# Patient Record
Sex: Female | Born: 1955 | Race: White | Hispanic: No | Smoking: Current every day smoker
Health system: Southern US, Community
[De-identification: ages and names within clinical notes are randomized; demographics above are authoritative.]

## PROBLEM LIST (undated history)

## (undated) DIAGNOSIS — L409 Psoriasis, unspecified: Secondary | ICD-10-CM

## (undated) DIAGNOSIS — E559 Vitamin D deficiency, unspecified: Secondary | ICD-10-CM

## (undated) DIAGNOSIS — K649 Unspecified hemorrhoids: Secondary | ICD-10-CM

## (undated) DIAGNOSIS — R911 Solitary pulmonary nodule: Secondary | ICD-10-CM

## (undated) DIAGNOSIS — N750 Cyst of Bartholin's gland: Secondary | ICD-10-CM

## (undated) DIAGNOSIS — B009 Herpesviral infection, unspecified: Secondary | ICD-10-CM

## (undated) DIAGNOSIS — E785 Hyperlipidemia, unspecified: Secondary | ICD-10-CM

## (undated) DIAGNOSIS — K769 Liver disease, unspecified: Secondary | ICD-10-CM

## (undated) DIAGNOSIS — E049 Nontoxic goiter, unspecified: Secondary | ICD-10-CM

## (undated) DIAGNOSIS — I10 Essential (primary) hypertension: Secondary | ICD-10-CM

## (undated) HISTORY — DX: Nontoxic goiter, unspecified: E04.9

## (undated) HISTORY — DX: Hyperlipidemia, unspecified: E78.5

## (undated) HISTORY — DX: Essential (primary) hypertension: I10

## (undated) HISTORY — DX: Liver disease, unspecified: K76.9

## (undated) HISTORY — DX: Unspecified hemorrhoids: K64.9

## (undated) HISTORY — DX: Cyst of Bartholin's gland: N75.0

## (undated) HISTORY — DX: Herpesviral infection, unspecified: B00.9

## (undated) HISTORY — PX: BREAST BIOPSY: SHX20

## (undated) HISTORY — DX: Solitary pulmonary nodule: R91.1

## (undated) HISTORY — DX: Psoriasis, unspecified: L40.9

## (undated) HISTORY — PX: WISDOM TOOTH EXTRACTION: SHX21

## (undated) HISTORY — DX: Vitamin D deficiency, unspecified: E55.9

---

## 2003-02-13 ENCOUNTER — Encounter: Payer: Self-pay | Admitting: Obstetrics and Gynecology

## 2003-02-13 ENCOUNTER — Ambulatory Visit (HOSPITAL_COMMUNITY): Admission: RE | Admit: 2003-02-13 | Discharge: 2003-02-13 | Payer: Self-pay | Admitting: Obstetrics and Gynecology

## 2004-04-25 ENCOUNTER — Ambulatory Visit (HOSPITAL_COMMUNITY): Admission: RE | Admit: 2004-04-25 | Discharge: 2004-04-25 | Payer: Self-pay | Admitting: Obstetrics and Gynecology

## 2005-07-10 ENCOUNTER — Ambulatory Visit (HOSPITAL_COMMUNITY): Admission: RE | Admit: 2005-07-10 | Discharge: 2005-07-10 | Payer: Self-pay | Admitting: Obstetrics and Gynecology

## 2006-07-21 ENCOUNTER — Ambulatory Visit: Payer: Self-pay | Admitting: Gastroenterology

## 2006-09-01 ENCOUNTER — Ambulatory Visit (HOSPITAL_COMMUNITY): Admission: RE | Admit: 2006-09-01 | Discharge: 2006-09-01 | Payer: Self-pay | Admitting: Obstetrics and Gynecology

## 2006-10-20 ENCOUNTER — Ambulatory Visit: Payer: Self-pay | Admitting: Gastroenterology

## 2006-12-04 ENCOUNTER — Encounter (INDEPENDENT_AMBULATORY_CARE_PROVIDER_SITE_OTHER): Payer: Self-pay | Admitting: Specialist

## 2006-12-04 ENCOUNTER — Ambulatory Visit (HOSPITAL_COMMUNITY): Admission: RE | Admit: 2006-12-04 | Discharge: 2006-12-04 | Payer: Self-pay | Admitting: Gastroenterology

## 2007-03-04 ENCOUNTER — Ambulatory Visit: Payer: Self-pay | Admitting: Gastroenterology

## 2008-01-21 ENCOUNTER — Other Ambulatory Visit: Admission: RE | Admit: 2008-01-21 | Discharge: 2008-01-21 | Payer: Self-pay | Admitting: Obstetrics and Gynecology

## 2008-01-21 ENCOUNTER — Ambulatory Visit (HOSPITAL_COMMUNITY): Admission: RE | Admit: 2008-01-21 | Discharge: 2008-01-21 | Payer: Self-pay | Admitting: Obstetrics and Gynecology

## 2008-07-04 ENCOUNTER — Ambulatory Visit: Payer: Self-pay | Admitting: Gastroenterology

## 2009-04-12 ENCOUNTER — Ambulatory Visit: Payer: Self-pay | Admitting: Gastroenterology

## 2009-06-19 ENCOUNTER — Ambulatory Visit (HOSPITAL_COMMUNITY): Admission: RE | Admit: 2009-06-19 | Discharge: 2009-06-19 | Payer: Self-pay | Admitting: Obstetrics and Gynecology

## 2009-06-19 ENCOUNTER — Other Ambulatory Visit: Admission: RE | Admit: 2009-06-19 | Discharge: 2009-06-19 | Payer: Self-pay | Admitting: Obstetrics and Gynecology

## 2010-08-27 ENCOUNTER — Ambulatory Visit (HOSPITAL_COMMUNITY)
Admission: RE | Admit: 2010-08-27 | Discharge: 2010-08-27 | Payer: Self-pay | Source: Home / Self Care | Admitting: Obstetrics and Gynecology

## 2010-08-28 ENCOUNTER — Other Ambulatory Visit
Admission: RE | Admit: 2010-08-28 | Discharge: 2010-08-28 | Payer: Self-pay | Source: Home / Self Care | Admitting: Obstetrics and Gynecology

## 2012-03-09 ENCOUNTER — Other Ambulatory Visit: Payer: Self-pay | Admitting: Obstetrics and Gynecology

## 2012-03-09 DIAGNOSIS — Z139 Encounter for screening, unspecified: Secondary | ICD-10-CM

## 2012-04-08 ENCOUNTER — Other Ambulatory Visit: Payer: Self-pay | Admitting: Adult Health

## 2012-04-08 ENCOUNTER — Ambulatory Visit (HOSPITAL_COMMUNITY)
Admission: RE | Admit: 2012-04-08 | Discharge: 2012-04-08 | Disposition: A | Discharge status: Home / Self Care | Payer: Managed Care, Other (non HMO) | Source: Ambulatory Visit | Attending: Obstetrics and Gynecology | Admitting: Obstetrics and Gynecology

## 2012-04-08 ENCOUNTER — Other Ambulatory Visit (HOSPITAL_COMMUNITY)
Admission: RE | Admit: 2012-04-08 | Discharge: 2012-04-08 | Disposition: A | Discharge status: Home / Self Care | Payer: Managed Care, Other (non HMO) | Source: Ambulatory Visit | Attending: Obstetrics and Gynecology | Admitting: Obstetrics and Gynecology

## 2012-04-08 ENCOUNTER — Ambulatory Visit (HOSPITAL_COMMUNITY)
Admission: RE | Admit: 2012-04-08 | Discharge: 2012-04-08 | Disposition: A | Discharge status: Home / Self Care | Payer: Managed Care, Other (non HMO) | Source: Ambulatory Visit | Attending: Adult Health | Admitting: Adult Health

## 2012-04-08 DIAGNOSIS — R0989 Other specified symptoms and signs involving the circulatory and respiratory systems: Secondary | ICD-10-CM

## 2012-04-08 DIAGNOSIS — Z1151 Encounter for screening for human papillomavirus (HPV): Secondary | ICD-10-CM | POA: Insufficient documentation

## 2012-04-08 DIAGNOSIS — Z01419 Encounter for gynecological examination (general) (routine) without abnormal findings: Secondary | ICD-10-CM | POA: Insufficient documentation

## 2012-04-08 DIAGNOSIS — D509 Iron deficiency anemia, unspecified: Secondary | ICD-10-CM | POA: Insufficient documentation

## 2012-04-08 DIAGNOSIS — Z1231 Encounter for screening mammogram for malignant neoplasm of breast: Secondary | ICD-10-CM | POA: Insufficient documentation

## 2012-04-08 DIAGNOSIS — Z139 Encounter for screening, unspecified: Secondary | ICD-10-CM

## 2013-04-01 ENCOUNTER — Other Ambulatory Visit: Payer: Self-pay | Admitting: Adult Health

## 2013-04-01 DIAGNOSIS — Z139 Encounter for screening, unspecified: Secondary | ICD-10-CM

## 2013-05-10 ENCOUNTER — Other Ambulatory Visit (HOSPITAL_COMMUNITY): Payer: Self-pay | Admitting: Obstetrics and Gynecology

## 2013-05-10 ENCOUNTER — Other Ambulatory Visit: Payer: Self-pay | Admitting: *Deleted

## 2013-05-10 MED ORDER — AMLODIPINE BESYLATE 10 MG PO TABS: 10.0000 mg | ORAL_TABLET | Freq: Every day | ORAL | Status: DC

## 2013-05-12 ENCOUNTER — Other Ambulatory Visit: Payer: Self-pay | Admitting: Adult Health

## 2013-05-12 ENCOUNTER — Ambulatory Visit (HOSPITAL_COMMUNITY): Payer: Managed Care, Other (non HMO)

## 2013-05-24 ENCOUNTER — Encounter: Payer: Self-pay | Admitting: Adult Health

## 2013-05-24 ENCOUNTER — Ambulatory Visit (INDEPENDENT_AMBULATORY_CARE_PROVIDER_SITE_OTHER): Payer: BC Managed Care – PPO | Admitting: Adult Health

## 2013-05-24 ENCOUNTER — Ambulatory Visit (HOSPITAL_COMMUNITY)
Admission: RE | Admit: 2013-05-24 | Discharge: 2013-05-24 | Disposition: A | Discharge status: Home / Self Care | Payer: BC Managed Care – PPO | Source: Ambulatory Visit | Attending: Adult Health | Admitting: Adult Health

## 2013-05-24 VITALS — BP 140/80 | HR 72 | Ht 64.0 in | Wt 146.0 lb

## 2013-05-24 DIAGNOSIS — Z139 Encounter for screening, unspecified: Secondary | ICD-10-CM

## 2013-05-24 DIAGNOSIS — Z8619 Personal history of other infectious and parasitic diseases: Secondary | ICD-10-CM

## 2013-05-24 DIAGNOSIS — Z1212 Encounter for screening for malignant neoplasm of rectum: Secondary | ICD-10-CM

## 2013-05-24 DIAGNOSIS — E041 Nontoxic single thyroid nodule: Secondary | ICD-10-CM

## 2013-05-24 DIAGNOSIS — I10 Essential (primary) hypertension: Secondary | ICD-10-CM

## 2013-05-24 DIAGNOSIS — Z1231 Encounter for screening mammogram for malignant neoplasm of breast: Secondary | ICD-10-CM | POA: Insufficient documentation

## 2013-05-24 DIAGNOSIS — Z01419 Encounter for gynecological examination (general) (routine) without abnormal findings: Secondary | ICD-10-CM

## 2013-05-24 DIAGNOSIS — B1920 Unspecified viral hepatitis C without hepatic coma: Secondary | ICD-10-CM | POA: Insufficient documentation

## 2013-05-24 LAB — HEMOCCULT GUIAC POC 1CARD (OFFICE): Fecal Occult Blood, POC: NEGATIVE

## 2013-05-24 MED ORDER — VALACYCLOVIR HCL 1 G PO TABS: 1000.0000 mg | ORAL_TABLET | ORAL | Status: DC | PRN

## 2013-05-24 MED ORDER — CALCIPOTRIENE 0.005 % EX OINT: TOPICAL_OINTMENT | CUTANEOUS | Status: DC | PRN

## 2013-05-24 NOTE — Patient Instructions (Addendum)
Get colonoscopy call Dr Lovell Sheehan Mammogram yearly Physical in 1 year Get thyroid US as scheduled

## 2013-05-24 NOTE — Progress Notes (Signed)
Patient ID: Julia Marsh, female   DOB: May 03, 1956, 57 y.o.   MRN: [id number] History of Present Illness: Arantza is a 57 year old white female married in for physical. She had a normal pap with negative HPV in July 2013.  Current Medications, Allergies, Past Medical History, Past Surgical History, Family History and Social History were reviewed in [employer] record.     Review of Systems: Patient denies any headaches, blurred vision, shortness of breath, chest pain, abdominal pain, problems with bowel movements, urination, or intercourse. No joint pain or mood changes. psorisis on elbows.   Physical Exam:BP 140/80  Pulse 72  Ht 5\' 4"  (1.626 m)  Wt 146 lb (66.225 kg)  BMI 25.05 kg/m2 General:  Well developed, well nourished, no acute distress Skin:  Warm and dry Neck:  Midline trachea, enlarged thyroid Lungs; Clear to auscultation bilaterally Breast:  No dominant palpable mass, retraction, or nipple discharge Cardiovascular: Regular rate and rhythm Abdomen:  Soft, non tender, no hepatosplenomegaly Pelvic:  External genitalia is normal in appearance.  The vagina is normal in appearance for age.The cervix is atrophic.  Uterus is felt to be normal size, shape, and contour.  No  adnexal masses or tenderness noted. Rectal: Good sphincter tone, no polyps, or hemorrhoids felt.  Hemoccult negative. Extremities:  No swelling or varicosities noted Psych:  Alert andcooperative   Impression: Yearly gyn exam-no pap History of hepatitis C Hypertension HSV Thyroid nodule    Plan: Thyroid US 9/4 at 2 pm at [facility] Physical in 1 year  Mammogram today and yearly Call Dr. Lovell Sheehan to schedule colonoscopy Refilled valtrex x 1 year and dovonex x 1.

## 2013-05-26 ENCOUNTER — Telehealth: Payer: Self-pay | Admitting: Adult Health

## 2013-05-26 ENCOUNTER — Ambulatory Visit (HOSPITAL_COMMUNITY)
Admission: RE | Admit: 2013-05-26 | Discharge: 2013-05-26 | Disposition: A | Discharge status: Home / Self Care | Payer: BC Managed Care – PPO | Source: Ambulatory Visit | Attending: Adult Health | Admitting: Adult Health

## 2013-05-26 DIAGNOSIS — E041 Nontoxic single thyroid nodule: Secondary | ICD-10-CM | POA: Insufficient documentation

## 2013-05-26 NOTE — Telephone Encounter (Signed)
Left message on thyroid US results

## 2014-04-17 ENCOUNTER — Other Ambulatory Visit: Payer: Self-pay | Admitting: Adult Health

## 2014-04-17 DIAGNOSIS — Z1231 Encounter for screening mammogram for malignant neoplasm of breast: Secondary | ICD-10-CM

## 2014-05-31 ENCOUNTER — Encounter: Payer: Self-pay | Admitting: Adult Health

## 2014-05-31 ENCOUNTER — Ambulatory Visit (HOSPITAL_COMMUNITY)
Admission: RE | Admit: 2014-05-31 | Discharge: 2014-05-31 | Disposition: A | Discharge status: Home / Self Care | Payer: BC Managed Care – PPO | Source: Ambulatory Visit | Attending: Adult Health | Admitting: Adult Health

## 2014-05-31 ENCOUNTER — Ambulatory Visit (INDEPENDENT_AMBULATORY_CARE_PROVIDER_SITE_OTHER): Payer: BC Managed Care – PPO | Admitting: Adult Health

## 2014-05-31 VITALS — BP 154/88 | HR 74 | Ht 64.5 in | Wt 152.5 lb

## 2014-05-31 DIAGNOSIS — Z1212 Encounter for screening for malignant neoplasm of rectum: Secondary | ICD-10-CM

## 2014-05-31 DIAGNOSIS — Z01419 Encounter for gynecological examination (general) (routine) without abnormal findings: Secondary | ICD-10-CM

## 2014-05-31 DIAGNOSIS — Z1231 Encounter for screening mammogram for malignant neoplasm of breast: Secondary | ICD-10-CM | POA: Diagnosis present

## 2014-05-31 DIAGNOSIS — I10 Essential (primary) hypertension: Secondary | ICD-10-CM

## 2014-05-31 LAB — HEMOCCULT GUIAC POC 1CARD (OFFICE): Fecal Occult Blood, POC: NEGATIVE

## 2014-05-31 MED ORDER — VALACYCLOVIR HCL 1 G PO TABS: 1000.0000 mg | ORAL_TABLET | ORAL | Status: DC | PRN

## 2014-05-31 MED ORDER — AMLODIPINE BESYLATE 10 MG PO TABS: 10.0000 mg | ORAL_TABLET | Freq: Every day | ORAL | Status: DC

## 2014-05-31 MED ORDER — VALACYCLOVIR HCL 1 G PO TABS: 1000.0000 mg | ORAL_TABLET | Freq: Every day | ORAL | Status: DC

## 2014-05-31 NOTE — Patient Instructions (Signed)
Pap and physical in 1 year mammogram yearly Colonoscopy advised Get flu shot TRY LUVENA for vaginal moisture and ASTRO GLIDE for sex

## 2014-05-31 NOTE — Addendum Note (Signed)
Addended by: Derrek Monaco A on: 05/31/2014 12:07 PM   Modules accepted: Orders

## 2014-05-31 NOTE — Progress Notes (Signed)
Patient ID: Julia Marsh, female   DOB: 05-02-1956, 58 y.o.   MRN: [id number] History of Present Illness: Julia Marsh is a 58 year old white female, married in for physical,she had a normal pap with negative HPV 04/08/12.Has vaginal dryness.Has some rectal spotting at times, has hemorrhoid.  Current Medications, Allergies, Past Medical History, Past Surgical History, Family History and Social History were reviewed in [employer] record.     Review of Systems:  Patient denies any headaches, blurred vision, shortness of breath, chest pain, abdominal pain, problems with bowel movements, urination.Not having sex at present,no joint pain or mood swings.   Physical Exam:BP 154/88  Pulse 74  Ht 5' 4.5" (1.638 m)  Wt 152 lb 8 oz (69.174 kg)  BMI 25.78 kg/m2 General:  Well developed, well nourished, no acute distress Skin:  Warm and dry,tan Neck:  Midline trachea, enlarged thyroid R>L,had [place] in 2013 and 2014 was stable. Lungs; Clear to auscultation bilaterally Breast:  No dominant palpable mass, retraction, or nipple discharge Cardiovascular: Regular rate and rhythm Abdomen:  Soft, non tender, no hepatosplenomegaly Pelvic:  External genitalia is normal in appearance for age no lesions.  The vagina has decrease color, moisture and rugae and there is marble sized left bartholin cyst,non tender.The cervix is smooth.  Uterus is felt to be normal size, shape, and contour.  No   adnexal masses or tenderness noted. Rectal: Good sphincter tone, no polyps, internal hemorrhoids felt.  Hemoccult negative. Extremities:  No swelling or varicosities noted Psych:  No mood changes,alert and cooperative,seems happy,she is retired and lives at [employer] part of the time and fishes a lot, her Mom is elderly and is close by.   Impression: Yearly gyn exam Hypertension     Plan: Physical and pap in 1 year Mammogram yearly Colonoscopy advised Check CBC,CMP,TSH and lipids  Try Luvena for vaginal  moisture and astro glide with sex Refilled valtrex x 1 year  Refilled norvasc 10 mg 1 daily #30 with 11 refills Get flu shot

## 2014-06-01 ENCOUNTER — Telehealth: Payer: Self-pay | Admitting: Adult Health

## 2014-06-01 LAB — LIPID PANEL
Cholesterol: 257 mg/dL — ABNORMAL HIGH (ref 0–200)
HDL: 76 mg/dL (ref >39)
LDL Cholesterol: 161 mg/dL — ABNORMAL HIGH (ref 0–99)
Total CHOL/HDL Ratio: 3.4 Ratio
Triglycerides: 100 mg/dL (ref <150)
VLDL: 20 mg/dL (ref 0–40)

## 2014-06-01 LAB — CBC
HCT: 45 % (ref 36.0–46.0)
Hemoglobin: 15.5 g/dL — ABNORMAL HIGH (ref 12.0–15.0)
MCH: 32 pg (ref 26.0–34.0)
MCHC: 34.4 g/dL (ref 30.0–36.0)
MCV: 93 fL (ref 78.0–100.0)
Platelets: 190 10*3/uL (ref 150–400)
RBC: 4.84 MIL/uL (ref 3.87–5.11)
RDW: 13.8 % (ref 11.5–15.5)
WBC: 6.2 10*3/uL (ref 4.0–10.5)

## 2014-06-01 LAB — COMPREHENSIVE METABOLIC PANEL
ALT: 26 U/L (ref 0–35)
AST: 18 U/L (ref 0–37)
Albumin: 4.5 g/dL (ref 3.5–5.2)
Alkaline Phosphatase: 87 U/L (ref 39–117)
BUN: 8 mg/dL (ref 6–23)
CO2: 27 mEq/L (ref 19–32)
Calcium: 9.7 mg/dL (ref 8.4–10.5)
Chloride: 103 mEq/L (ref 96–112)
Creat: 0.72 mg/dL (ref 0.50–1.10)
Glucose, Bld: 89 mg/dL (ref 70–99)
Potassium: 4.4 mEq/L (ref 3.5–5.3)
Sodium: 137 mEq/L (ref 135–145)
Total Bilirubin: 0.6 mg/dL (ref 0.2–1.2)
Total Protein: 7.1 g/dL (ref 6.0–8.3)

## 2014-06-01 LAB — TSH: TSH: 3.076 u[IU]/mL (ref 0.350–4.500)

## 2014-06-01 NOTE — Telephone Encounter (Signed)
Pt aware of labs, will mail her a copy

## 2014-07-24 ENCOUNTER — Encounter: Payer: Self-pay | Admitting: Adult Health

## 2014-11-24 NOTE — H&P (Signed)
  NTS SOAP Note  Vital Signs:  Vitals as of: 11/28/4663: Systolic 993: Diastolic 91: Heart Rate 80: Temp 96.66F: Height 56ft 5.5in: Weight 158Lbs 0 Ounces: BMI 25.89  BMI : 25.89 kg/m2  Subjective: This 59 year old female presents fora screening TCS.  Nver has had a colonoscopy.  Denies any lower GI complaints.  Review of Symptoms:  Constitutional:unremarkable   Head:unremarkable Eyes:unremarkable   sinus problems Cardiovascular:  unremarkable Respiratory:cough Gastrointestinal:  unremarkable   Genitourinary:unremarkable   Musculoskeletal:unremarkable Skin:unremarkable Hematolgic/Lymphatic:unremarkable   Allergic/Immunologic:unremarkable   Past Medical History:  Reviewed  Past Medical History  Surgical History: none Medical Problems: HTN Allergies: pcn Medications: amlodipine,  valcyclovir   Social History:Reviewed  Social History  Preferred Language: English Race:  White Ethnicity: Not Hispanic / Latino Age: 57 year Marital Status:  M Alcohol: 2-3 per day   Smoking Status: Current every day smoker reviewed on 11/23/2014 Started Date:  Packs per day: 1.00 Functional Status reviewed on 11/23/2014 ------------------------------------------------ Bathing: Normal Cooking: Normal Dressing: Normal Driving: Normal Eating: Normal Managing Meds: Normal Oral Care: Normal Shopping: Normal Toileting: Normal Transferring: Normal Walking: Normal Cognitive Status reviewed on 11/23/2014 ------------------------------------------------ Attention: Normal Decision Making: Normal Language: Normal Memory: Normal Motor: Normal Perception: Normal Problem Solving: Normal Visual and Spatial: Normal   Family History:Reviewed  Family Health History Mother, Living; Atrial fibrillation;  Father, Deceased; Stroke (CVA);     Objective Information: General:Well appearing, well nourished in no distress. Heart:RRR, no murmur Lungs:  CTA  bilaterally, no wheezes, rhonchi, rales.  Breathing unlabored. Abdomen:Soft, NT/ND, no HSM, no masses. deferred to procedure  Assessment:Need for screening TCS  Diagnoses: V76.51  Z12.11 Screening for malignant neoplasm of colon (Encounter for screening for malignant neoplasm of colon)  Procedures: 57017 - OFFICE OUTPATIENT NEW 20 MINUTES    Plan:  Scheduled for a screening TCS on 12/19/14.   Patient Education:Alternative treatments to surgery were discussed with patient (and family).  Risks and benefits  of procedure including bleeding and perforation were fully explained to the patient (and family) who gave informed consent. Patient/family questions were addressed.  Follow-up:Pending Surgery

## 2014-12-19 ENCOUNTER — Encounter (HOSPITAL_COMMUNITY)
Admission: RE | Disposition: A | Discharge status: Home / Self Care | Payer: Self-pay | Source: Ambulatory Visit | Attending: General Surgery

## 2014-12-19 ENCOUNTER — Encounter (HOSPITAL_COMMUNITY): Payer: Self-pay

## 2014-12-19 ENCOUNTER — Ambulatory Visit (HOSPITAL_COMMUNITY)
Admission: RE | Admit: 2014-12-19 | Discharge: 2014-12-19 | Disposition: A | Discharge status: Home / Self Care | Payer: BLUE CROSS/BLUE SHIELD | Source: Ambulatory Visit | Attending: General Surgery | Admitting: General Surgery

## 2014-12-19 DIAGNOSIS — D120 Benign neoplasm of cecum: Secondary | ICD-10-CM | POA: Insufficient documentation

## 2014-12-19 DIAGNOSIS — I10 Essential (primary) hypertension: Secondary | ICD-10-CM | POA: Diagnosis not present

## 2014-12-19 DIAGNOSIS — F17200 Nicotine dependence, unspecified, uncomplicated: Secondary | ICD-10-CM | POA: Diagnosis not present

## 2014-12-19 DIAGNOSIS — Z1211 Encounter for screening for malignant neoplasm of colon: Secondary | ICD-10-CM | POA: Diagnosis present

## 2014-12-19 DIAGNOSIS — Z79899 Other long term (current) drug therapy: Secondary | ICD-10-CM | POA: Insufficient documentation

## 2014-12-19 HISTORY — PX: COLONOSCOPY: SHX5424

## 2014-12-19 SURGERY — COLONOSCOPY
Anesthesia: Moderate Sedation

## 2014-12-19 MED ORDER — MEPERIDINE HCL 50 MG/ML IJ SOLN
INTRAMUSCULAR | Status: DC | PRN
  Administered 2014-12-19: 50 mg via INTRAVENOUS

## 2014-12-19 MED ORDER — MIDAZOLAM HCL 5 MG/5ML IJ SOLN
INTRAMUSCULAR | Status: AC
  Filled 2014-12-19: qty 5

## 2014-12-19 MED ORDER — SODIUM CHLORIDE 0.9 % IV SOLN
INTRAVENOUS | Status: DC
  Administered 2014-12-19: 08:00:00 via INTRAVENOUS

## 2014-12-19 MED ORDER — MEPERIDINE HCL 50 MG/ML IJ SOLN
INTRAMUSCULAR | Status: AC
  Filled 2014-12-19: qty 1

## 2014-12-19 MED ORDER — MIDAZOLAM HCL 5 MG/5ML IJ SOLN
INTRAMUSCULAR | Status: DC | PRN
  Administered 2014-12-19: 1 mg via INTRAVENOUS
  Administered 2014-12-19: 3 mg via INTRAVENOUS

## 2014-12-19 MED ORDER — STERILE WATER FOR IRRIGATION IR SOLN
Status: DC | PRN
  Administered 2014-12-19: 09:00:00

## 2014-12-19 NOTE — Op Note (Signed)
[facility] [address] [place], [address]   COLONOSCOPY PROCEDURE REPORT     EXAM DATE: 2014/12/26  PATIENT NAME:      Julia Marsh, Julia Marsh           MR #:      [id number]  BIRTHDATE:       11-01-1955      VISIT #:     [id number]  ATTENDING:     Aviva Signs, MD     STATUS:     outpatient ASSISTANT:  INDICATIONS:  The patient is a 59 yr old female here for a colonoscopy due to average risk patient for colon cancer. PROCEDURE PERFORMED:     Colonoscopy with snare polypectomy MEDICATIONS:     Demerol 50 mg IV and Versed 4 mg IV ESTIMATED BLOOD LOSS:     None  CONSENT: The patient understands the risks and benefits of the procedure and understands that these risks include, but are not limited to: sedation, allergic reaction, infection, perforation and/or bleeding. Alternative means of evaluation and treatment include, among others: physical exam, x-rays, and/or surgical intervention. The patient elects to proceed with this endoscopic procedure.  DESCRIPTION OF PROCEDURE: During intra-op preparation period all mechanical & medical equipment was checked for proper function. Hand hygiene and appropriate measures for infection prevention was taken. After the risks, benefits and alternatives of the procedure were thoroughly explained, Informed consent was verified, confirmed and timeout was successfully executed by the treatment team. A digital exam revealed no abnormalities of the rectum. The EC-[id number] ([id number]) endoscope was introduced through the anus and advanced to the cecum, which was identified by both the appendix and ileocecal valve. adequate (Trilyte was used) The instrument was then slowly withdrawn as the colon was fully examined.Estimated blood loss is zero unless otherwise noted in this procedure report.   COLON FINDINGS: A sessile polyp ranging between 3-50mm in size with a friable surface was found at the cecum.  A polypectomy was performed  using snare cautery.  The resection was complete, the polyp tissue was completely retrieved and sent to histology.   The examination was otherwise normal. Retroflexed views revealed no abnormalities. The scope was then completely withdrawn from the patient and the procedure terminated. SCOPE WITHDRAWAL TIME: 9    ADVERSE EVENTS:      There were no immediate complications.  IMPRESSIONS:     1.  Sessile polyp ranging between 3-44mm in size was found at the cecum; polypectomy was performed using snare cautery 2.  The examination was otherwise normal  RECOMMENDATIONS:     1.  Await pathology results 2.  Repeat Colonoscopy in 3 years. RECALL:  _____________________________ Aviva Signs, MD eSigned:  Aviva Signs, MD 12/26/14 8:58 AM   cc:   CPT CODES: ICD CODES:  The ICD and CPT codes recommended by this software are interpretations from the data that the clinical staff has captured with the software.  The verification of the translation of this report to the ICD and CPT codes and modifiers is the sole responsibility of the health care institution and practicing physician where this report was generated.  [facility]. will not be held responsible for the validity of the ICD and CPT codes included on this report.  AMA assumes no liability for data contained or not contained herein. CPT is a Designer, television/film set of the [employer].

## 2014-12-19 NOTE — Interval H&P Note (Signed)
History and Physical Interval Note:  12/19/2014 8:32 AM  Julia Marsh  has presented today for surgery, with the diagnosis of screening  The various methods of treatment have been discussed with the patient and family. After consideration of risks, benefits and other options for treatment, the patient has consented to  Procedure(s): COLONOSCOPY (N/A) as a surgical intervention .  The patient's history has been reviewed, patient examined, no change in status, stable for surgery.  I have reviewed the patient's chart and labs.  Questions were answered to the patient's satisfaction.     Aviva Signs A

## 2014-12-19 NOTE — Discharge Instructions (Signed)
Colon Polyps Polyps are lumps of extra tissue growing inside the body. Polyps can grow in the large intestine (colon). Most colon polyps are noncancerous (benign). However, some colon polyps can become cancerous over time. Polyps that are larger than a pea may be harmful. To be safe, caregivers remove and test all polyps. CAUSES  Polyps form when mutations in the genes cause your cells to grow and divide even though no more tissue is needed. RISK FACTORS There are a number of risk factors that can increase your chances of getting colon polyps. They include:  Being older than 50 years.  Family history of colon polyps or colon cancer.  Long-term colon diseases, such as colitis or Crohn disease.  Being overweight.  Smoking.  Being inactive.  Drinking too much alcohol. SYMPTOMS  Most small polyps do not cause symptoms. If symptoms are present, they may include:  Blood in the stool. The stool may look dark red or black.  Constipation or diarrhea that lasts longer than 1 week. DIAGNOSIS People often do not know they have polyps until their caregiver finds them during a regular checkup. Your caregiver can use 4 tests to check for polyps:  Digital rectal exam. The caregiver wears gloves and feels inside the rectum. This test would find polyps only in the rectum.  Barium enema. The caregiver puts a liquid called barium into your rectum before taking X-rays of your colon. Barium makes your colon look white. Polyps are dark, so they are easy to see in the X-ray pictures.  Sigmoidoscopy. A thin, flexible tube (sigmoidoscope) is placed into your rectum. The sigmoidoscope has a light and tiny camera in it. The caregiver uses the sigmoidoscope to look at the last third of your colon.  Colonoscopy. This test is like sigmoidoscopy, but the caregiver looks at the entire colon. This is the most common method for finding and removing polyps. TREATMENT  Any polyps will be removed during a  sigmoidoscopy or colonoscopy. The polyps are then tested for cancer. PREVENTION  To help lower your risk of getting more colon polyps:  Eat plenty of fruits and vegetables. Avoid eating fatty foods.  Do not smoke.  Avoid drinking alcohol.  Exercise every day.  Lose weight if recommended by your caregiver.  Eat plenty of calcium and folate. Foods that are rich in calcium include milk, cheese, and broccoli. Foods that are rich in folate include chickpeas, kidney beans, and spinach. HOME CARE INSTRUCTIONS Keep all follow-up appointments as directed by your caregiver. You may need periodic exams to check for polyps. SEEK MEDICAL CARE IF: You notice bleeding during a bowel movement. Document Released: 06/04/2004 Document Revised: 12/01/2011 Document Reviewed: 11/18/2011 [facility] Patient Information 2015 [place], [place]. This information is not intended to replace advice given to you by your health care provider. Make sure you discuss any questions you have with your health care provider. Colonoscopy, Care After Refer to this sheet in the next few weeks. These instructions provide you with information on caring for yourself after your procedure. Your health care provider may also give you more specific instructions. Your treatment has been planned according to current medical practices, but problems sometimes occur. Call your health care provider if you have any problems or questions after your procedure. WHAT TO EXPECT AFTER THE PROCEDURE  After your procedure, it is typical to have the following:  A small amount of blood in your stool.  Moderate amounts of gas and mild abdominal cramping or bloating. [place]  TO EXPECT AFTER THE PROCEDURE   After your procedure, it is typical to have the following:   A small amount of blood in your stool.   Moderate amounts of gas and mild abdominal cramping or bloating.  HOME CARE INSTRUCTIONS   Do not drive, operate machinery, or sign important documents for 24 hours.   You may shower and resume your regular physical activities, but move at a slower pace for the first 24 hours.   Take frequent rest periods for the first 24 hours.   Walk around or put a warm  pack on your abdomen to help reduce abdominal cramping and bloating.   Drink enough fluids to keep your urine clear or pale yellow.   You may resume your normal diet as instructed by your health care provider. Avoid heavy or fried foods that are hard to digest.   Avoid drinking alcohol for 24 hours or as instructed by your health care provider.   Only take over-the-counter or prescription medicines as directed by your health care provider.   If a tissue sample (biopsy) was taken during your procedure:   Do not take aspirin or blood thinners for 7 days, or as instructed by your health care provider.   Do not drink alcohol for 7 days, or as instructed by your health care provider.   Eat soft foods for the first 24 hours.  SEEK MEDICAL CARE IF:  You have persistent spotting of blood in your stool 2-3 days after the procedure.  SEEK IMMEDIATE MEDICAL CARE IF:   You have more than a small spotting of blood in your stool.   You pass large blood clots in your stool.   Your abdomen is swollen (distended).   You have nausea or vomiting.   You have a fever.   You have increasing abdominal pain that is not relieved with medicine.  Document Released: 04/22/2004 Document Revised: 06/29/2013 Document Reviewed: 05/16/2013  [employer] Patient Information 2015 [employer]. This information is not intended to replace advice given to you by your health care provider. Make sure you discuss any questions you have with your health care provider.

## 2014-12-20 ENCOUNTER — Encounter (HOSPITAL_COMMUNITY): Payer: Self-pay | Admitting: General Surgery

## 2015-05-09 ENCOUNTER — Other Ambulatory Visit: Payer: Self-pay | Admitting: Adult Health

## 2015-05-09 DIAGNOSIS — Z1231 Encounter for screening mammogram for malignant neoplasm of breast: Secondary | ICD-10-CM

## 2015-06-04 ENCOUNTER — Other Ambulatory Visit (HOSPITAL_COMMUNITY)
Admission: RE | Admit: 2015-06-04 | Discharge: 2015-06-04 | Disposition: A | Discharge status: Home / Self Care | Payer: BLUE CROSS/BLUE SHIELD | Source: Ambulatory Visit | Attending: Adult Health | Admitting: Adult Health

## 2015-06-04 ENCOUNTER — Ambulatory Visit (INDEPENDENT_AMBULATORY_CARE_PROVIDER_SITE_OTHER): Payer: BLUE CROSS/BLUE SHIELD | Admitting: Adult Health

## 2015-06-04 ENCOUNTER — Ambulatory Visit (HOSPITAL_COMMUNITY)
Admission: RE | Admit: 2015-06-04 | Discharge: 2015-06-04 | Disposition: A | Discharge status: Home / Self Care | Payer: BLUE CROSS/BLUE SHIELD | Source: Ambulatory Visit | Attending: Adult Health | Admitting: Adult Health

## 2015-06-04 ENCOUNTER — Encounter: Payer: Self-pay | Admitting: Adult Health

## 2015-06-04 VITALS — BP 142/88 | HR 76 | Ht 64.0 in | Wt 152.5 lb

## 2015-06-04 DIAGNOSIS — Z1151 Encounter for screening for human papillomavirus (HPV): Secondary | ICD-10-CM | POA: Diagnosis present

## 2015-06-04 DIAGNOSIS — Z1231 Encounter for screening mammogram for malignant neoplasm of breast: Secondary | ICD-10-CM | POA: Diagnosis not present

## 2015-06-04 DIAGNOSIS — Z1212 Encounter for screening for malignant neoplasm of rectum: Secondary | ICD-10-CM | POA: Diagnosis not present

## 2015-06-04 DIAGNOSIS — Z01419 Encounter for gynecological examination (general) (routine) without abnormal findings: Secondary | ICD-10-CM

## 2015-06-04 DIAGNOSIS — Z8619 Personal history of other infectious and parasitic diseases: Secondary | ICD-10-CM

## 2015-06-04 DIAGNOSIS — I10 Essential (primary) hypertension: Secondary | ICD-10-CM

## 2015-06-04 DIAGNOSIS — E041 Nontoxic single thyroid nodule: Secondary | ICD-10-CM

## 2015-06-04 LAB — HEMOCCULT GUIAC POC 1CARD (OFFICE): FECAL OCCULT BLD: NEGATIVE

## 2015-06-04 MED ORDER — AMLODIPINE BESYLATE 10 MG PO TABS: 10.0000 mg | ORAL_TABLET | Freq: Every day | ORAL | Status: DC

## 2015-06-04 MED ORDER — VALACYCLOVIR HCL 1 G PO TABS: 1000.0000 mg | ORAL_TABLET | Freq: Every day | ORAL | Status: DC

## 2015-06-04 NOTE — Progress Notes (Signed)
Patient ID: Julia Marsh, female   DOB: 1956-06-22, 59 y.o.   MRN: [id number] History of Present Illness: Julia Marsh is a 59 year old white female, married, in for well woman gyn exam and pap.She says she has gained weight but is same as last year.She [place] of time at the beach.   Current Medications, Allergies, Past Medical History, Past Surgical History, Family History and Social History were reviewed in [employer] record.     Review of Systems: Patient denies any headaches, hearing loss, fatigue, blurred vision, shortness of breath, chest pain, abdominal pain, problems with bowel movements, urination, or intercourse(has decreased libido). No joint pain or mood swings.    Physical Exam:BP 142/88 mmHg  Pulse 76  Ht 5\' 4"  (1.626 m)  Wt 152 lb 8 oz (69.174 kg)  BMI 26.16 kg/m2 General:  Well developed, well nourished, no acute distress Skin:  Warm and dry Neck:  Midline trachea, thyroid, right nodule,feels stable, good ROM, no lymphadenopathy,no carotid bruits heard Lungs; Clear to auscultation bilaterally Breast:  No dominant palpable mass, retraction, or nipple discharge Cardiovascular: Regular rate and rhythm Abdomen:  Soft, non tender, no hepatosplenomegaly Pelvic:  External genitalia is normal in appearance, no lesions.  The vagina has decreased color, moisture and rugae, has marble sized bartholin cyst on left,non inflamed, not tender. Urethra has no lesions or masses. The cervix is smooth,pap with HPV performed.  Uterus is felt to be normal size, shape, and contour.  No adnexal masses or tenderness noted.Bladder is non tender, no masses felt. Rectal: Good sphincter tone, no polyps, or hemorrhoids felt.  Hemoccult negative. Extremities/musculoskeletal:  No swelling or varicosities noted, no clubbing or cyanosis Psych:  No mood changes, alert and cooperative,seems happy Discussed having dates for regular sex and as long as bartholin cyst not painful will leave  it alone.She takes norvasc at night and says she needs refills on it and valtrex.  Impression: Well woman gyn exam with pap  Hypertension Right thyroid nodule History of herpes    Plan: Check CBC,CMP,TSH and lipids Get mammogram yearly, has one today Colonoscopy per GI Refilled norvasc 10 mg #30 take 1 daily with 11 refills Refilled valtrex 1 gm #30 take 1 daily with 11 refills See Dr Scot Dock flu shot and shingles and pneumonia vaccines

## 2015-06-04 NOTE — Patient Instructions (Signed)
Physical in 1 year Mammogram yearly See Dr Scot Dock flu shot, shingles and pneumonia shot  Colonoscopy per GI

## 2015-06-05 LAB — COMPREHENSIVE METABOLIC PANEL
ALT: 46 IU/L — ABNORMAL HIGH (ref 0–32)
AST: 35 IU/L (ref 0–40)
Albumin/Globulin Ratio: 1.8 (ref 1.1–2.5)
Albumin: 4.9 g/dL — ABNORMAL HIGH (ref 3.6–4.8)
Alkaline Phosphatase: 105 IU/L (ref 39–117)
BUN/Creatinine Ratio: 10 — ABNORMAL LOW (ref 11–26)
BUN: 8 mg/dL (ref 8–27)
Bilirubin Total: 0.5 mg/dL (ref 0.0–1.2)
CALCIUM: 9.8 mg/dL (ref 8.7–10.3)
CO2: 25 mmol/L (ref 18–29)
CREATININE: 0.78 mg/dL (ref 0.57–1.00)
Chloride: 100 mmol/L (ref 97–108)
GFR calc Af Amer: 96 mL/min/{1.73_m2} (ref >59)
GFR, EST NON AFRICAN AMERICAN: 83 mL/min/{1.73_m2} (ref >59)
GLUCOSE: 110 mg/dL — AB (ref 65–99)
Globulin, Total: 2.7 g/dL (ref 1.5–4.5)
Potassium: 4.6 mmol/L (ref 3.5–5.2)
Sodium: 142 mmol/L (ref 134–144)
Total Protein: 7.6 g/dL (ref 6.0–8.5)

## 2015-06-05 LAB — CYTOLOGY - PAP

## 2015-06-05 LAB — CBC
Hematocrit: 47 % — ABNORMAL HIGH (ref 34.0–46.6)
Hemoglobin: 16.3 g/dL — ABNORMAL HIGH (ref 11.1–15.9)
MCH: 33.3 pg — ABNORMAL HIGH (ref 26.6–33.0)
MCHC: 34.7 g/dL (ref 31.5–35.7)
MCV: 96 fL (ref 79–97)
Platelets: 252 10*3/uL (ref 150–379)
RBC: 4.89 x10E6/uL (ref 3.77–5.28)
RDW: 13.7 % (ref 12.3–15.4)
WBC: 7.1 10*3/uL (ref 3.4–10.8)

## 2015-06-05 LAB — LIPID PANEL
CHOLESTEROL TOTAL: 288 mg/dL — AB (ref 100–199)
Chol/HDL Ratio: 3.5 ratio units (ref 0.0–4.4)
HDL: 82 mg/dL (ref >39)
LDL CALC: 188 mg/dL — AB (ref 0–99)
Triglycerides: 92 mg/dL (ref 0–149)
VLDL CHOLESTEROL CAL: 18 mg/dL (ref 5–40)

## 2015-06-05 LAB — TSH: TSH: 4.43 u[IU]/mL (ref 0.450–4.500)

## 2015-06-06 ENCOUNTER — Telehealth: Payer: Self-pay | Admitting: Adult Health

## 2015-06-06 NOTE — Telephone Encounter (Signed)
Pt aware of labs and pap 

## 2016-05-05 ENCOUNTER — Other Ambulatory Visit: Payer: Self-pay | Admitting: Adult Health

## 2016-05-05 DIAGNOSIS — Z1231 Encounter for screening mammogram for malignant neoplasm of breast: Secondary | ICD-10-CM

## 2016-06-09 ENCOUNTER — Ambulatory Visit (INDEPENDENT_AMBULATORY_CARE_PROVIDER_SITE_OTHER): Payer: BLUE CROSS/BLUE SHIELD | Admitting: Adult Health

## 2016-06-09 ENCOUNTER — Ambulatory Visit (HOSPITAL_COMMUNITY)
Admission: RE | Admit: 2016-06-09 | Discharge: 2016-06-09 | Disposition: A | Discharge status: Home / Self Care | Payer: BLUE CROSS/BLUE SHIELD | Source: Ambulatory Visit | Attending: Adult Health | Admitting: Adult Health

## 2016-06-09 ENCOUNTER — Encounter: Payer: Self-pay | Admitting: Adult Health

## 2016-06-09 VITALS — BP 130/72 | HR 72 | Ht 64.0 in | Wt 154.6 lb

## 2016-06-09 DIAGNOSIS — Z1231 Encounter for screening mammogram for malignant neoplasm of breast: Secondary | ICD-10-CM | POA: Insufficient documentation

## 2016-06-09 DIAGNOSIS — E041 Nontoxic single thyroid nodule: Secondary | ICD-10-CM

## 2016-06-09 DIAGNOSIS — Z01419 Encounter for gynecological examination (general) (routine) without abnormal findings: Secondary | ICD-10-CM

## 2016-06-09 DIAGNOSIS — Z1211 Encounter for screening for malignant neoplasm of colon: Secondary | ICD-10-CM | POA: Diagnosis not present

## 2016-06-09 DIAGNOSIS — Z01411 Encounter for gynecological examination (general) (routine) with abnormal findings: Secondary | ICD-10-CM | POA: Diagnosis not present

## 2016-06-09 DIAGNOSIS — I10 Essential (primary) hypertension: Secondary | ICD-10-CM | POA: Diagnosis not present

## 2016-06-09 LAB — HEMOCCULT GUIAC POC 1CARD (OFFICE): Fecal Occult Blood, POC: NEGATIVE

## 2016-06-09 MED ORDER — AMLODIPINE BESYLATE 10 MG PO TABS: 10.0000 mg | ORAL_TABLET | Freq: Every day | ORAL | 11 refills | Status: DC

## 2016-06-09 MED ORDER — CALCIPOTRIENE 0.005 % EX CREA: TOPICAL_CREAM | Freq: Two times a day (BID) | CUTANEOUS | 0 refills | Status: DC

## 2016-06-09 NOTE — Patient Instructions (Signed)
Physical in 1 year Mammogram today  Thyroid US 9/20 at 10:30 am

## 2016-06-09 NOTE — Progress Notes (Addendum)
Patient ID: Julia Marsh, female   DOB: 1956/03/20, 60 y.o.   MRN: [id number] History of Present Illness: Julia Marsh is a 60 year old white female, married in for a well woman gyn exam,she had a normal pap with negative HPV 06/04/15.Husband has lung cancer but doing well and her brother is having issues now and she is concerned, may have to go to [facility] to check on him. PCP is Dr Willey Blade but she has not seen him lately.   Current Medications, Allergies, Past Medical History, Past Surgical History, Family History and Social History were reviewed in [employer] record.     Review of Systems: Patient denies any headaches, hearing loss, fatigue, blurred vision, shortness of breath, chest pain, abdominal pain, problems with bowel movements, urination, or intercourse(not having sex). No joint pain or mood swings.She notices vaginal odor at times.    Physical Exam:BP 130/72   Pulse 72   Ht 5\' 4"  (1.626 m)   Wt 154 lb 9.6 oz (70.1 kg)   BMI 26.54 kg/m  General:  Well developed, well nourished, no acute distress Skin:  Warm and dry Neck:  Midline trachea,  Right thyroid nodule felt, good ROM, no lymphadenopathy Lungs; Clear to auscultation bilaterally Breast:  No dominant palpable mass, retraction, or nipple discharge Cardiovascular: Regular rate and rhythm Abdomen:  Soft, non tender, no hepatosplenomegaly Pelvic:  External genitalia is normal in appearance, no lesions.  The vagina is pale with loss of color,mositure and rugae. Urethra has no lesions or masses. The cervix is smooth.  Uterus is felt to be normal size, shape, and contour.  No adnexal masses or tenderness noted.Bladder is non tender, no masses felt. Rectal: Good sphincter tone, no polyps, + hemorrhoids felt.  Hemoccult negative. Extremities/musculoskeletal:  No swelling or varicosities noted, no clubbing or cyanosis Psych:  No mood changes, alert and cooperative,seems happy.but stressed  She is down to less than 10  cigarettes a day.  Impression:    1. Well woman exam with routine gynecological exam   2. Essential hypertension   3. Thyroid nodule      Plan: Check CBC,CMP,TSH and lipids,A1c and vitamin D Mammogram today Thyroid US 9/20 at 10:30 am at [facility] Physical in 1 year, pap in 2019 To gt flu shot today Refilled norvasc 10 mg #30 take 1 daily with 11 refills Refilled calcipotriene 0.005% cream #60 gm apply bid prn, no refills Make appt to see Dr Willey Blade since it has been awhile

## 2016-06-10 ENCOUNTER — Telehealth: Payer: Self-pay | Admitting: Adult Health

## 2016-06-10 ENCOUNTER — Encounter: Payer: Self-pay | Admitting: Adult Health

## 2016-06-10 DIAGNOSIS — E559 Vitamin D deficiency, unspecified: Secondary | ICD-10-CM

## 2016-06-10 DIAGNOSIS — E785 Hyperlipidemia, unspecified: Secondary | ICD-10-CM

## 2016-06-10 HISTORY — DX: Vitamin D deficiency, unspecified: E55.9

## 2016-06-10 HISTORY — DX: Hyperlipidemia, unspecified: E78.5

## 2016-06-10 LAB — COMPREHENSIVE METABOLIC PANEL
A/G RATIO: 1.5 (ref 1.2–2.2)
ALT: 45 IU/L — AB (ref 0–32)
AST: 34 IU/L (ref 0–40)
Albumin: 4.9 g/dL — ABNORMAL HIGH (ref 3.6–4.8)
Alkaline Phosphatase: 108 IU/L (ref 39–117)
BUN/Creatinine Ratio: 13 (ref 12–28)
BUN: 12 mg/dL (ref 8–27)
Bilirubin Total: 0.5 mg/dL (ref 0.0–1.2)
CO2: 22 mmol/L (ref 18–29)
Calcium: 10.3 mg/dL (ref 8.7–10.3)
Chloride: 95 mmol/L — ABNORMAL LOW (ref 96–106)
Creatinine, Ser: 0.94 mg/dL (ref 0.57–1.00)
GFR calc Af Amer: 76 mL/min/{1.73_m2} (ref >59)
GFR calc non Af Amer: 66 mL/min/{1.73_m2} (ref >59)
Globulin, Total: 3.2 g/dL (ref 1.5–4.5)
Glucose: 94 mg/dL (ref 65–99)
POTASSIUM: 4.4 mmol/L (ref 3.5–5.2)
SODIUM: 139 mmol/L (ref 134–144)
Total Protein: 8.1 g/dL (ref 6.0–8.5)

## 2016-06-10 LAB — CBC
HEMATOCRIT: 47.5 % — AB (ref 34.0–46.6)
Hemoglobin: 16.6 g/dL — ABNORMAL HIGH (ref 11.1–15.9)
MCH: 33.3 pg — ABNORMAL HIGH (ref 26.6–33.0)
MCHC: 34.9 g/dL (ref 31.5–35.7)
MCV: 95 fL (ref 79–97)
PLATELETS: 223 10*3/uL (ref 150–379)
RBC: 4.99 x10E6/uL (ref 3.77–5.28)
RDW: 13.7 % (ref 12.3–15.4)
WBC: 7.4 10*3/uL (ref 3.4–10.8)

## 2016-06-10 LAB — HEMOGLOBIN A1C
Est. average glucose Bld gHb Est-mCnc: 105 mg/dL
Hgb A1c MFr Bld: 5.3 % (ref 4.8–5.6)

## 2016-06-10 LAB — LIPID PANEL
CHOLESTEROL TOTAL: 303 mg/dL — AB (ref 100–199)
Chol/HDL Ratio: 4.7 ratio units — ABNORMAL HIGH (ref 0.0–4.4)
HDL: 64 mg/dL (ref >39)
LDL Calculated: 177 mg/dL — ABNORMAL HIGH (ref 0–99)
TRIGLYCERIDES: 310 mg/dL — AB (ref 0–149)
VLDL Cholesterol Cal: 62 mg/dL — ABNORMAL HIGH (ref 5–40)

## 2016-06-10 LAB — TSH: TSH: 3.4 u[IU]/mL (ref 0.450–4.500)

## 2016-06-10 LAB — VITAMIN D 25 HYDROXY (VIT D DEFICIENCY, FRACTURES): VIT D 25 HYDROXY: 27.8 ng/mL — AB (ref 30.0–100.0)

## 2016-06-10 MED ORDER — CHOLECALCIFEROL 125 MCG (5000 UT) PO CAPS: 5000.0000 [IU] | ORAL_CAPSULE | Freq: Every day | ORAL | Status: DC

## 2016-06-10 NOTE — Telephone Encounter (Signed)
Left message that thyroid US was normal

## 2016-06-10 NOTE — Telephone Encounter (Signed)
Pt aware of labs, elevated cholesterol and trig. will recheck lipids fasting,in 2-3 weeks, and take vitamin  D 3 5000 IU daily

## 2016-06-11 ENCOUNTER — Ambulatory Visit (HOSPITAL_COMMUNITY): Payer: BLUE CROSS/BLUE SHIELD

## 2016-06-30 ENCOUNTER — Other Ambulatory Visit: Payer: Self-pay | Admitting: Adult Health

## 2016-07-03 ENCOUNTER — Telehealth: Payer: Self-pay | Admitting: Adult Health

## 2016-07-03 LAB — COMPREHENSIVE METABOLIC PANEL
ALK PHOS: 95 IU/L (ref 39–117)
ALT: 33 IU/L — ABNORMAL HIGH (ref 0–32)
AST: 31 IU/L (ref 0–40)
Albumin/Globulin Ratio: 1.7 (ref 1.2–2.2)
Albumin: 4.6 g/dL (ref 3.6–4.8)
BUN/Creatinine Ratio: 10 — ABNORMAL LOW (ref 12–28)
BUN: 8 mg/dL (ref 8–27)
Bilirubin Total: 0.3 mg/dL (ref 0.0–1.2)
CALCIUM: 9.2 mg/dL (ref 8.7–10.3)
CO2: 25 mmol/L (ref 18–29)
CREATININE: 0.81 mg/dL (ref 0.57–1.00)
Chloride: 102 mmol/L (ref 96–106)
GFR calc Af Amer: 91 mL/min/{1.73_m2} (ref >59)
GFR calc non Af Amer: 79 mL/min/{1.73_m2} (ref >59)
Globulin, Total: 2.7 g/dL (ref 1.5–4.5)
Glucose: 88 mg/dL (ref 65–99)
POTASSIUM: 4.6 mmol/L (ref 3.5–5.2)
Sodium: 143 mmol/L (ref 134–144)
Total Protein: 7.3 g/dL (ref 6.0–8.5)

## 2016-07-03 LAB — LIPID PANEL
CHOLESTEROL TOTAL: 250 mg/dL — AB (ref 100–199)
Chol/HDL Ratio: 3.9 ratio units (ref 0.0–4.4)
HDL: 64 mg/dL (ref >39)
LDL Calculated: 162 mg/dL — ABNORMAL HIGH (ref 0–99)
TRIGLYCERIDES: 122 mg/dL (ref 0–149)
VLDL Cholesterol Cal: 24 mg/dL (ref 5–40)

## 2016-07-03 NOTE — Telephone Encounter (Signed)
Left message that labs better,keep eating healthy and stay active

## 2017-03-08 ENCOUNTER — Other Ambulatory Visit: Payer: Self-pay | Admitting: Adult Health

## 2017-06-08 ENCOUNTER — Other Ambulatory Visit: Payer: Self-pay | Admitting: Adult Health

## 2017-06-08 DIAGNOSIS — Z1231 Encounter for screening mammogram for malignant neoplasm of breast: Secondary | ICD-10-CM

## 2017-06-10 ENCOUNTER — Ambulatory Visit (INDEPENDENT_AMBULATORY_CARE_PROVIDER_SITE_OTHER): Payer: BLUE CROSS/BLUE SHIELD | Admitting: Adult Health

## 2017-06-10 ENCOUNTER — Ambulatory Visit (HOSPITAL_COMMUNITY)
Admission: RE | Admit: 2017-06-10 | Discharge: 2017-06-10 | Disposition: A | Discharge status: Home / Self Care | Payer: BLUE CROSS/BLUE SHIELD | Source: Ambulatory Visit | Attending: Adult Health | Admitting: Adult Health

## 2017-06-10 ENCOUNTER — Encounter: Payer: Self-pay | Admitting: Adult Health

## 2017-06-10 VITALS — BP 110/70 | HR 76 | Ht 64.4 in | Wt 147.0 lb

## 2017-06-10 DIAGNOSIS — Z01411 Encounter for gynecological examination (general) (routine) with abnormal findings: Secondary | ICD-10-CM

## 2017-06-10 DIAGNOSIS — Z1212 Encounter for screening for malignant neoplasm of rectum: Secondary | ICD-10-CM

## 2017-06-10 DIAGNOSIS — E559 Vitamin D deficiency, unspecified: Secondary | ICD-10-CM

## 2017-06-10 DIAGNOSIS — Z01419 Encounter for gynecological examination (general) (routine) without abnormal findings: Secondary | ICD-10-CM

## 2017-06-10 DIAGNOSIS — Z23 Encounter for immunization: Secondary | ICD-10-CM | POA: Diagnosis not present

## 2017-06-10 DIAGNOSIS — Z113 Encounter for screening for infections with a predominantly sexual mode of transmission: Secondary | ICD-10-CM

## 2017-06-10 DIAGNOSIS — I10 Essential (primary) hypertension: Secondary | ICD-10-CM | POA: Diagnosis not present

## 2017-06-10 DIAGNOSIS — Z1211 Encounter for screening for malignant neoplasm of colon: Secondary | ICD-10-CM | POA: Diagnosis not present

## 2017-06-10 DIAGNOSIS — Z1231 Encounter for screening mammogram for malignant neoplasm of breast: Secondary | ICD-10-CM | POA: Diagnosis not present

## 2017-06-10 DIAGNOSIS — E785 Hyperlipidemia, unspecified: Secondary | ICD-10-CM | POA: Diagnosis not present

## 2017-06-10 LAB — HEMOCCULT GUIAC POC 1CARD (OFFICE): Fecal Occult Blood, POC: NEGATIVE

## 2017-06-10 MED ORDER — AMLODIPINE BESYLATE 10 MG PO TABS: 10.0000 mg | ORAL_TABLET | Freq: Every day | ORAL | 11 refills | Status: DC

## 2017-06-10 NOTE — Progress Notes (Signed)
Patient ID: Julia Marsh, female   DOB: 1956-03-07, 61 y.o.   MRN: [id number] History of Present Illness: Julia Marsh is a 61 year old white female, recently widowed this spring, in for well woman gyn exam, she had normal pap and negative HPV 06/04/15. She has new friend and would like to be sexually active.She is going to have grand baby in about 2 weeks.  PCP is Dr Willey Blade.    Current Medications, Allergies, Past Medical History, Past Surgical History, Family History and Social History were reviewed in [employer] record.     Review of Systems: Patient denies any headaches, hearing loss, fatigue, blurred vision, shortness of breath, chest pain, abdominal pain, problems with bowel movements, urination, or intercourse(not currently active, but wants to be). No joint pain or mood swings.    Physical Exam:BP 110/70 (BP Location: Left Arm, Patient Position: Sitting, Cuff Size: Small)   Pulse 76   Ht 5' 4.4" (1.636 m)   Wt 147 lb (66.7 kg)   BMI 24.92 kg/m   General:  Well developed, well nourished, no acute distress Skin:  Warm and dry,tan Neck:  Midline trachea, normal thyroid, good ROM, no lymphadenopathy,no carotid bruits heard Lungs; Clear to auscultation bilaterally Breast:  No dominant palpable mass, retraction, or nipple discharge Cardiovascular: Regular rate and rhythm Abdomen:  Soft, non tender, no hepatosplenomegaly Pelvic:  External genitalia is normal in appearance, no lesions.  The vagina is pale with loss of moisture and rugae. Urethra has no lesions or masses. The cervix is smooth.  Uterus is felt to be normal size, shape, and contour.  No adnexal masses or tenderness noted.Bladder is non tender, no masses felt.GC/CHL obtained. Rectal: Good sphincter tone, no polyps, or hemorrhoids felt.  Hemoccult negative. Extremities/musculoskeletal:  No swelling or varicosities noted, no clubbing or cyanosis Psych:  No mood changes, alert and cooperative,seems happy PHQ 2  score 0.discussed using luvena for added vaginal moisture and astoglide with sex and massage at introitus to stretch, may even get sex toy, try different positions, for comfort.   Impression: 1. Encounter for well woman exam   2. Screening for colorectal cancer   3. Essential hypertension   4. Dyslipidemia   5. Mild vitamin D deficiency   6. Screening examination for STD (sexually transmitted disease)       Plan: Check CBC,CMP,TSH and lipids,vitamin D,HIV and RPR GC/CHL sent  Get flu shot and Tdap  Try luvena and get astroglide for use with sex Pap and physical in 1 year

## 2017-06-12 DIAGNOSIS — E559 Vitamin D deficiency, unspecified: Secondary | ICD-10-CM | POA: Diagnosis not present

## 2017-06-12 DIAGNOSIS — Z01419 Encounter for gynecological examination (general) (routine) without abnormal findings: Secondary | ICD-10-CM | POA: Diagnosis not present

## 2017-06-12 DIAGNOSIS — I10 Essential (primary) hypertension: Secondary | ICD-10-CM | POA: Diagnosis not present

## 2017-06-12 DIAGNOSIS — E785 Hyperlipidemia, unspecified: Secondary | ICD-10-CM | POA: Diagnosis not present

## 2017-06-12 LAB — GC/CHLAMYDIA PROBE AMP
Chlamydia trachomatis, NAA: NEGATIVE
Neisseria gonorrhoeae by PCR: NEGATIVE

## 2017-06-13 LAB — COMPREHENSIVE METABOLIC PANEL
A/G RATIO: 1.7 (ref 1.2–2.2)
ALBUMIN: 4.8 g/dL (ref 3.6–4.8)
ALK PHOS: 98 IU/L (ref 39–117)
ALT: 20 IU/L (ref 0–32)
AST: 20 IU/L (ref 0–40)
BUN / CREAT RATIO: 13 (ref 12–28)
BUN: 12 mg/dL (ref 8–27)
Bilirubin Total: 0.8 mg/dL (ref 0.0–1.2)
CHLORIDE: 99 mmol/L (ref 96–106)
CO2: 24 mmol/L (ref 20–29)
Calcium: 10.3 mg/dL (ref 8.7–10.3)
Creatinine, Ser: 0.91 mg/dL (ref 0.57–1.00)
GFR calc non Af Amer: 68 mL/min/{1.73_m2} (ref >59)
GFR, EST AFRICAN AMERICAN: 78 mL/min/{1.73_m2} (ref >59)
Globulin, Total: 2.8 g/dL (ref 1.5–4.5)
Glucose: 96 mg/dL (ref 65–99)
POTASSIUM: 4.7 mmol/L (ref 3.5–5.2)
Sodium: 141 mmol/L (ref 134–144)
Total Protein: 7.6 g/dL (ref 6.0–8.5)

## 2017-06-13 LAB — LIPID PANEL
CHOL/HDL RATIO: 3.2 ratio (ref 0.0–4.4)
Cholesterol, Total: 265 mg/dL — ABNORMAL HIGH (ref 100–199)
HDL: 84 mg/dL (ref >39)
LDL Calculated: 160 mg/dL — ABNORMAL HIGH (ref 0–99)
Triglycerides: 103 mg/dL (ref 0–149)
VLDL Cholesterol Cal: 21 mg/dL (ref 5–40)

## 2017-06-13 LAB — CBC
HEMATOCRIT: 49.1 % — AB (ref 34.0–46.6)
HEMOGLOBIN: 17 g/dL — AB (ref 11.1–15.9)
MCH: 33.5 pg — AB (ref 26.6–33.0)
MCHC: 34.6 g/dL (ref 31.5–35.7)
MCV: 97 fL (ref 79–97)
Platelets: 219 10*3/uL (ref 150–379)
RBC: 5.07 x10E6/uL (ref 3.77–5.28)
RDW: 14.1 % (ref 12.3–15.4)
WBC: 6.6 10*3/uL (ref 3.4–10.8)

## 2017-06-13 LAB — HIV ANTIBODY (ROUTINE TESTING W REFLEX): HIV Screen 4th Generation wRfx: NONREACTIVE

## 2017-06-13 LAB — VITAMIN D 25 HYDROXY (VIT D DEFICIENCY, FRACTURES): Vit D, 25-Hydroxy: 50.1 ng/mL (ref 30.0–100.0)

## 2017-06-13 LAB — TSH: TSH: 2.99 u[IU]/mL (ref 0.450–4.500)

## 2017-06-13 LAB — RPR: RPR Ser Ql: NONREACTIVE

## 2017-06-13 LAB — HEPATITIS C ANTIBODY: Hep C Virus Ab: >11 s/co ratio — ABNORMAL HIGH (ref 0.0–0.9)

## 2017-06-15 ENCOUNTER — Other Ambulatory Visit: Payer: Self-pay | Admitting: Adult Health

## 2017-06-15 DIAGNOSIS — Z8619 Personal history of other infectious and parasitic diseases: Secondary | ICD-10-CM

## 2017-06-16 ENCOUNTER — Telehealth: Payer: Self-pay | Admitting: Adult Health

## 2017-06-16 NOTE — Telephone Encounter (Signed)
Left message about labs, has hx hept C

## 2017-12-21 DIAGNOSIS — H524 Presbyopia: Secondary | ICD-10-CM | POA: Diagnosis not present

## 2018-03-17 ENCOUNTER — Other Ambulatory Visit: Payer: Self-pay | Admitting: Adult Health

## 2018-04-26 ENCOUNTER — Telehealth: Payer: Self-pay | Admitting: Adult Health

## 2018-04-26 DIAGNOSIS — N390 Urinary tract infection, site not specified: Secondary | ICD-10-CM | POA: Diagnosis not present

## 2018-04-26 NOTE — Telephone Encounter (Signed)
Left message @ 3:31 pm. JSY 

## 2018-04-26 NOTE — Telephone Encounter (Signed)
All circuits busy @ 12:52 pm. JSY

## 2018-04-26 NOTE — Telephone Encounter (Signed)
Pt thinks she has a uti in a lot of pain and wants to be worked in Please advise if she needs to be seen or just come by and dip urine

## 2018-04-27 NOTE — Telephone Encounter (Signed)
Spoke with pt. Pt went to Urgent Care and was placed on med. Encounter closed. Clifford

## 2018-05-25 ENCOUNTER — Other Ambulatory Visit: Payer: Self-pay | Admitting: Adult Health

## 2018-05-25 DIAGNOSIS — Z1231 Encounter for screening mammogram for malignant neoplasm of breast: Secondary | ICD-10-CM

## 2018-06-10 ENCOUNTER — Other Ambulatory Visit: Payer: BLUE CROSS/BLUE SHIELD | Admitting: Adult Health

## 2018-06-24 ENCOUNTER — Encounter: Payer: Self-pay | Admitting: Adult Health

## 2018-06-24 ENCOUNTER — Ambulatory Visit (INDEPENDENT_AMBULATORY_CARE_PROVIDER_SITE_OTHER): Payer: BLUE CROSS/BLUE SHIELD | Admitting: Adult Health

## 2018-06-24 ENCOUNTER — Ambulatory Visit (HOSPITAL_COMMUNITY)
Admission: RE | Admit: 2018-06-24 | Discharge: 2018-06-24 | Disposition: A | Discharge status: Home / Self Care | Payer: BLUE CROSS/BLUE SHIELD | Source: Ambulatory Visit | Attending: Adult Health | Admitting: Adult Health

## 2018-06-24 ENCOUNTER — Other Ambulatory Visit (HOSPITAL_COMMUNITY)
Admission: RE | Admit: 2018-06-24 | Discharge: 2018-06-24 | Disposition: A | Discharge status: Home / Self Care | Payer: BLUE CROSS/BLUE SHIELD | Source: Ambulatory Visit | Attending: Adult Health | Admitting: Adult Health

## 2018-06-24 VITALS — BP 143/89 | HR 76 | Ht 64.0 in | Wt 143.0 lb

## 2018-06-24 DIAGNOSIS — E785 Hyperlipidemia, unspecified: Secondary | ICD-10-CM

## 2018-06-24 DIAGNOSIS — Z01419 Encounter for gynecological examination (general) (routine) without abnormal findings: Secondary | ICD-10-CM

## 2018-06-24 DIAGNOSIS — Z8619 Personal history of other infectious and parasitic diseases: Secondary | ICD-10-CM | POA: Diagnosis not present

## 2018-06-24 DIAGNOSIS — Z1231 Encounter for screening mammogram for malignant neoplasm of breast: Secondary | ICD-10-CM | POA: Insufficient documentation

## 2018-06-24 DIAGNOSIS — I10 Essential (primary) hypertension: Secondary | ICD-10-CM | POA: Diagnosis not present

## 2018-06-24 DIAGNOSIS — Z1211 Encounter for screening for malignant neoplasm of colon: Secondary | ICD-10-CM | POA: Diagnosis not present

## 2018-06-24 DIAGNOSIS — Z720 Tobacco use: Secondary | ICD-10-CM | POA: Insufficient documentation

## 2018-06-24 DIAGNOSIS — E559 Vitamin D deficiency, unspecified: Secondary | ICD-10-CM

## 2018-06-24 DIAGNOSIS — Z113 Encounter for screening for infections with a predominantly sexual mode of transmission: Secondary | ICD-10-CM | POA: Insufficient documentation

## 2018-06-24 DIAGNOSIS — F17200 Nicotine dependence, unspecified, uncomplicated: Secondary | ICD-10-CM | POA: Diagnosis not present

## 2018-06-24 DIAGNOSIS — Z1212 Encounter for screening for malignant neoplasm of rectum: Secondary | ICD-10-CM | POA: Diagnosis not present

## 2018-06-24 DIAGNOSIS — Z131 Encounter for screening for diabetes mellitus: Secondary | ICD-10-CM | POA: Insufficient documentation

## 2018-06-24 LAB — HEMOCCULT GUIAC POC 1CARD (OFFICE): FECAL OCCULT BLD: NEGATIVE

## 2018-06-24 MED ORDER — BUPROPION HCL ER (SMOKING DET) 150 MG PO TB12: 150.0000 mg | ORAL_TABLET | Freq: Two times a day (BID) | ORAL | 2 refills | Status: DC

## 2018-06-24 MED ORDER — NYSTATIN-TRIAMCINOLONE 100000-0.1 UNIT/GM-% EX OINT: 1.0000 "application " | TOPICAL_OINTMENT | Freq: Two times a day (BID) | CUTANEOUS | 1 refills | Status: DC

## 2018-06-24 MED ORDER — AMLODIPINE BESYLATE 10 MG PO TABS: 10.0000 mg | ORAL_TABLET | Freq: Every day | ORAL | 4 refills | Status: DC
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
## 2018-06-25 LAB — COMPREHENSIVE METABOLIC PANEL
A/G RATIO: 2 (ref 1.2–2.2)
ALBUMIN: 5 g/dL — AB (ref 3.6–4.8)
ALK PHOS: 94 IU/L (ref 39–117)
ALT: 62 IU/L — ABNORMAL HIGH (ref 0–32)
AST: 58 IU/L — ABNORMAL HIGH (ref 0–40)
BILIRUBIN TOTAL: 0.6 mg/dL (ref 0.0–1.2)
BUN / CREAT RATIO: 13 (ref 12–28)
BUN: 9 mg/dL (ref 8–27)
CHLORIDE: 101 mmol/L (ref 96–106)
CO2: 25 mmol/L (ref 20–29)
Calcium: 10.1 mg/dL (ref 8.7–10.3)
Creatinine, Ser: 0.71 mg/dL (ref 0.57–1.00)
GFR calc Af Amer: 105 mL/min/{1.73_m2} (ref >59)
GFR calc non Af Amer: 91 mL/min/{1.73_m2} (ref >59)
GLOBULIN, TOTAL: 2.5 g/dL (ref 1.5–4.5)
Glucose: 91 mg/dL (ref 65–99)
Potassium: 4.3 mmol/L (ref 3.5–5.2)
SODIUM: 140 mmol/L (ref 134–144)
Total Protein: 7.5 g/dL (ref 6.0–8.5)

## 2018-06-25 LAB — CBC
HEMATOCRIT: 47.7 % — AB (ref 34.0–46.6)
HEMOGLOBIN: 16.1 g/dL — AB (ref 11.1–15.9)
MCH: 32.5 pg (ref 26.6–33.0)
MCHC: 33.8 g/dL (ref 31.5–35.7)
MCV: 96 fL (ref 79–97)
Platelets: 229 10*3/uL (ref 150–450)
RBC: 4.95 x10E6/uL (ref 3.77–5.28)
RDW: 14.1 % (ref 12.3–15.4)
WBC: 7.3 10*3/uL (ref 3.4–10.8)

## 2018-06-25 LAB — RPR: RPR Ser Ql: NONREACTIVE

## 2018-06-25 LAB — LIPID PANEL
CHOL/HDL RATIO: 2.5 ratio (ref 0.0–4.4)
Cholesterol, Total: 256 mg/dL — ABNORMAL HIGH (ref 100–199)
HDL: 102 mg/dL (ref >39)
LDL Calculated: 135 mg/dL — ABNORMAL HIGH (ref 0–99)
Triglycerides: 94 mg/dL (ref 0–149)
VLDL Cholesterol Cal: 19 mg/dL (ref 5–40)

## 2018-06-25 LAB — CYTOLOGY - PAP
CHLAMYDIA, DNA PROBE: NEGATIVE
Diagnosis: NEGATIVE
HPV: NOT DETECTED
Neisseria Gonorrhea: NEGATIVE

## 2018-06-25 LAB — HEMOGLOBIN A1C
Est. average glucose Bld gHb Est-mCnc: 111 mg/dL
Hgb A1c MFr Bld: 5.5 % (ref 4.8–5.6)

## 2018-06-25 LAB — HIV ANTIBODY (ROUTINE TESTING W REFLEX): HIV SCREEN 4TH GENERATION: NONREACTIVE

## 2018-06-25 LAB — VITAMIN D 25 HYDROXY (VIT D DEFICIENCY, FRACTURES): VIT D 25 HYDROXY: 96 ng/mL (ref 30.0–100.0)

## 2018-06-25 LAB — TSH: TSH: 2.67 u[IU]/mL (ref 0.450–4.500)

## 2018-06-28 ENCOUNTER — Telehealth: Payer: Self-pay | Admitting: Adult Health

## 2018-06-28 NOTE — Telephone Encounter (Signed)
Pt aware of labs,and liver enzymes elevated, so decrease or  stop alcohol for now and recheck liver enzymes the end of November, all other labs, pap and mammogram good

## 2018-07-14 ENCOUNTER — Telehealth: Payer: Self-pay | Admitting: Adult Health

## 2018-07-14 MED ORDER — VARENICLINE TARTRATE 0.5 MG X 11 & 1 MG X 42 PO MISC: ORAL | 0 refills | Status: DC

## 2018-07-14 MED ORDER — VARENICLINE TARTRATE 1 MG PO TABS: 1.0000 mg | ORAL_TABLET | Freq: Two times a day (BID) | ORAL | 1 refills | Status: DC

## 2018-07-14 NOTE — Telephone Encounter (Signed)
Wants to see if you can call in Chantix for her to [facility]/ wellburtrin did not work

## 2018-07-14 NOTE — Telephone Encounter (Signed)
rx chantix at her request

## 2018-07-27 ENCOUNTER — Ambulatory Visit (HOSPITAL_COMMUNITY): Payer: BLUE CROSS/BLUE SHIELD

## 2018-08-10 ENCOUNTER — Ambulatory Visit (HOSPITAL_COMMUNITY): Payer: BLUE CROSS/BLUE SHIELD

## 2018-09-08 ENCOUNTER — Ambulatory Visit (HOSPITAL_COMMUNITY)
Admission: RE | Admit: 2018-09-08 | Discharge: 2018-09-08 | Disposition: A | Discharge status: Home / Self Care | Payer: BLUE CROSS/BLUE SHIELD | Source: Ambulatory Visit | Attending: Adult Health | Admitting: Adult Health

## 2018-09-08 DIAGNOSIS — Z720 Tobacco use: Secondary | ICD-10-CM | POA: Diagnosis not present

## 2018-09-08 DIAGNOSIS — F17210 Nicotine dependence, cigarettes, uncomplicated: Secondary | ICD-10-CM | POA: Diagnosis not present

## 2018-09-10 ENCOUNTER — Telehealth: Payer: Self-pay | Admitting: Adult Health

## 2018-09-10 ENCOUNTER — Encounter: Payer: Self-pay | Admitting: Adult Health

## 2018-09-10 DIAGNOSIS — R911 Solitary pulmonary nodule: Secondary | ICD-10-CM

## 2018-09-10 HISTORY — DX: Solitary pulmonary nodule: R91.1

## 2018-09-10 NOTE — Telephone Encounter (Signed)
Pt aware CT showed 6.9 mm nodule in lung will repeat CT in 6 months and she is trying to quit smoking

## 2019-03-28 ENCOUNTER — Telehealth: Payer: Self-pay | Admitting: *Deleted

## 2019-03-28 ENCOUNTER — Telehealth: Payer: Self-pay | Admitting: Adult Health

## 2019-03-28 ENCOUNTER — Other Ambulatory Visit: Payer: Self-pay | Admitting: Adult Health

## 2019-03-28 DIAGNOSIS — R918 Other nonspecific abnormal finding of lung field: Secondary | ICD-10-CM

## 2019-03-28 NOTE — Telephone Encounter (Signed)
Insurance company would only precert regular CT, not LCS since has 6.9 mm nodule, precert # [id number] valid to 09/23/19

## 2019-03-28 NOTE — Telephone Encounter (Signed)
Patient called stating she needs to schedule her CT. Patient states she has to get one every 6 months. Please advise

## 2019-03-28 NOTE — Telephone Encounter (Signed)
CT chest LCS nodule, 6 month follow up scheduled for 7/23 at 11 am, be there at 10:45 and Angie to ck on precert

## 2019-04-14 ENCOUNTER — Ambulatory Visit (HOSPITAL_COMMUNITY)
Admission: RE | Admit: 2019-04-14 | Discharge: 2019-04-14 | Disposition: A | Discharge status: Home / Self Care | Payer: BC Managed Care – PPO | Source: Ambulatory Visit | Attending: Adult Health | Admitting: Adult Health

## 2019-04-14 ENCOUNTER — Other Ambulatory Visit: Payer: Self-pay

## 2019-04-14 ENCOUNTER — Ambulatory Visit (HOSPITAL_COMMUNITY): Payer: BLUE CROSS/BLUE SHIELD

## 2019-04-14 DIAGNOSIS — R918 Other nonspecific abnormal finding of lung field: Secondary | ICD-10-CM | POA: Insufficient documentation

## 2019-04-14 DIAGNOSIS — J439 Emphysema, unspecified: Secondary | ICD-10-CM | POA: Diagnosis not present

## 2019-04-14 DIAGNOSIS — R911 Solitary pulmonary nodule: Secondary | ICD-10-CM | POA: Diagnosis not present

## 2019-04-20 ENCOUNTER — Telehealth: Payer: Self-pay | Admitting: Adult Health

## 2019-04-20 NOTE — Telephone Encounter (Signed)
Pt aware that nodule in lung is stable, repeat CT in 6-12 months and that it showed DJD,calcifications coronary artery and tiny kidney stone, and emphysema, make follow up with Dr Willey Blade or can refer to cardiologist

## 2019-04-28 ENCOUNTER — Other Ambulatory Visit: Payer: Self-pay | Admitting: Adult Health

## 2019-04-28 DIAGNOSIS — I10 Essential (primary) hypertension: Secondary | ICD-10-CM | POA: Diagnosis not present

## 2019-04-28 DIAGNOSIS — J439 Emphysema, unspecified: Secondary | ICD-10-CM | POA: Diagnosis not present

## 2019-04-28 DIAGNOSIS — R599 Enlarged lymph nodes, unspecified: Secondary | ICD-10-CM | POA: Diagnosis not present

## 2019-04-28 DIAGNOSIS — I700 Atherosclerosis of aorta: Secondary | ICD-10-CM | POA: Diagnosis not present

## 2019-05-02 DIAGNOSIS — Z23 Encounter for immunization: Secondary | ICD-10-CM | POA: Diagnosis not present

## 2019-06-15 ENCOUNTER — Other Ambulatory Visit (HOSPITAL_COMMUNITY): Payer: Self-pay | Admitting: Adult Health

## 2019-06-15 DIAGNOSIS — Z1231 Encounter for screening mammogram for malignant neoplasm of breast: Secondary | ICD-10-CM

## 2019-06-28 ENCOUNTER — Telehealth: Payer: Self-pay | Admitting: Adult Health

## 2019-06-28 NOTE — Telephone Encounter (Signed)

## 2019-06-29 ENCOUNTER — Encounter: Payer: Self-pay | Admitting: Adult Health

## 2019-06-29 ENCOUNTER — Ambulatory Visit (HOSPITAL_COMMUNITY): Payer: BC Managed Care – PPO

## 2019-06-29 ENCOUNTER — Other Ambulatory Visit: Payer: Self-pay

## 2019-06-29 ENCOUNTER — Ambulatory Visit (INDEPENDENT_AMBULATORY_CARE_PROVIDER_SITE_OTHER): Payer: BC Managed Care – PPO | Admitting: Adult Health

## 2019-06-29 VITALS — BP 116/76 | HR 64 | Ht 65.0 in | Wt 139.0 lb

## 2019-06-29 DIAGNOSIS — I10 Essential (primary) hypertension: Secondary | ICD-10-CM

## 2019-06-29 DIAGNOSIS — Z8619 Personal history of other infectious and parasitic diseases: Secondary | ICD-10-CM

## 2019-06-29 DIAGNOSIS — Z1211 Encounter for screening for malignant neoplasm of colon: Secondary | ICD-10-CM | POA: Diagnosis not present

## 2019-06-29 DIAGNOSIS — Z1212 Encounter for screening for malignant neoplasm of rectum: Secondary | ICD-10-CM | POA: Diagnosis not present

## 2019-06-29 DIAGNOSIS — F17200 Nicotine dependence, unspecified, uncomplicated: Secondary | ICD-10-CM

## 2019-06-29 DIAGNOSIS — R911 Solitary pulmonary nodule: Secondary | ICD-10-CM

## 2019-06-29 DIAGNOSIS — E785 Hyperlipidemia, unspecified: Secondary | ICD-10-CM

## 2019-06-29 DIAGNOSIS — Z01419 Encounter for gynecological examination (general) (routine) without abnormal findings: Secondary | ICD-10-CM

## 2019-06-29 LAB — HEMOCCULT GUIAC POC 1CARD (OFFICE): Fecal Occult Blood, POC: NEGATIVE

## 2019-06-29 NOTE — Progress Notes (Signed)
Patient ID: Julia Marsh, female   DOB: [birth year], 63 y.o.   MRN: [id number] History of Present Illness:  Julia Marsh is a 63 year old white female, widowed,PM in for a well woman gyn exam,she had a normal pap with negative HPV 06/24/18. She lives alone and is active, mows her yard.  PCP is Dr Willey Blade.  Current Medications, Allergies, Past Medical History, Past Surgical History, Family History and Social History were reviewed in [employer] record.     Review of Systems: Patient denies any headaches, hearing loss, fatigue, blurred vision, shortness of breath, chest pain, abdominal pain, problems with bowel movements, urination, or intercourse. No joint pain or mood swings.Has some burning at navel at times.  She is trying chantix to stop smoking   Physical Exam:BP 116/76 (BP Location: Right Arm, Patient Position: Sitting, Cuff Size: Normal)   Pulse 64   Ht 5\' 5"  (1.651 m)   Wt 139 lb (63 kg)   BMI 23.13 kg/m  General:  Well developed, well nourished, no acute distress Skin:  Warm and dry Neck:  Midline trachea, normal thyroid, good ROM, no lymphadenopathy,no carotid bruits heard Lungs; Clear to auscultation bilaterally Breast:  No dominant palpable mass, retraction, or nipple discharge Cardiovascular: Regular rate and rhythm Abdomen:  Soft, non tender, no hepatosplenomegaly Pelvic:  External genitalia is normal in appearance, no lesions.  The vagina is pale, with loss of moisture and rugae . Urethra has no lesions or masses. The cervix is smooth.  Uterus is felt to be normal size, shape, and contour.  No adnexal masses or tenderness noted.Bladder is non tender, no masses felt. Rectal: Good sphincter tone, no polyps, or hemorrhoids felt.  Hemoccult negative. Extremities/musculoskeletal:  No swelling or varicosities noted, no clubbing or cyanosis Psych:  No mood changes, alert and cooperative,seems happy Fall risk is low PHQ 9 score is 1 Examination chaperoned by Diona Fanti CMA.  Impression and Plan: 1. Encounter for well woman exam with routine gynecological exam -physical in 1 year -mammogram yearly -labs next year  2. Encounter for colorectal cancer screening -call Dr Arnoldo Morale about colonoscopy  3. Smoker -she is trying to stop, using chantix  4. History of hepatitis C   5. Essential hypertension -continue meds, has refills she says  6. Dyslipidemia -stay active, and watch diet  7. Incidental lung nodule, > 95mm and < 70mm -Follow up CT in July 2021

## 2019-07-06 ENCOUNTER — Other Ambulatory Visit: Payer: Self-pay | Admitting: Adult Health

## 2019-07-07 ENCOUNTER — Ambulatory Visit (HOSPITAL_COMMUNITY)
Admission: RE | Admit: 2019-07-07 | Discharge: 2019-07-07 | Disposition: A | Discharge status: Home / Self Care | Payer: BC Managed Care – PPO | Source: Ambulatory Visit | Attending: Adult Health | Admitting: Adult Health

## 2019-07-07 ENCOUNTER — Other Ambulatory Visit: Payer: Self-pay

## 2019-07-07 DIAGNOSIS — Z1231 Encounter for screening mammogram for malignant neoplasm of breast: Secondary | ICD-10-CM | POA: Insufficient documentation

## 2019-07-29 DIAGNOSIS — E785 Hyperlipidemia, unspecified: Secondary | ICD-10-CM | POA: Diagnosis not present

## 2019-07-29 DIAGNOSIS — I10 Essential (primary) hypertension: Secondary | ICD-10-CM | POA: Diagnosis not present

## 2019-10-14 ENCOUNTER — Other Ambulatory Visit: Payer: Self-pay | Admitting: Internal Medicine

## 2019-10-14 DIAGNOSIS — R918 Other nonspecific abnormal finding of lung field: Secondary | ICD-10-CM

## 2019-10-24 ENCOUNTER — Ambulatory Visit (HOSPITAL_COMMUNITY): Payer: BC Managed Care – PPO

## 2019-11-03 ENCOUNTER — Other Ambulatory Visit: Payer: Self-pay | Admitting: *Deleted

## 2019-11-03 MED ORDER — CALCIPOTRIENE 0.005 % EX CREA: TOPICAL_CREAM | Freq: Two times a day (BID) | CUTANEOUS | 0 refills | Status: DC

## 2019-11-04 ENCOUNTER — Other Ambulatory Visit: Payer: Self-pay

## 2019-11-04 ENCOUNTER — Ambulatory Visit (HOSPITAL_COMMUNITY)
Admission: RE | Admit: 2019-11-04 | Discharge: 2019-11-04 | Disposition: A | Discharge status: Home / Self Care | Payer: BC Managed Care – PPO | Source: Ambulatory Visit | Attending: Internal Medicine | Admitting: Internal Medicine

## 2019-11-04 DIAGNOSIS — R918 Other nonspecific abnormal finding of lung field: Secondary | ICD-10-CM | POA: Insufficient documentation

## 2019-11-07 DIAGNOSIS — Z23 Encounter for immunization: Secondary | ICD-10-CM | POA: Diagnosis not present

## 2019-11-30 DIAGNOSIS — Z23 Encounter for immunization: Secondary | ICD-10-CM | POA: Diagnosis not present

## 2019-12-26 DIAGNOSIS — I10 Essential (primary) hypertension: Secondary | ICD-10-CM | POA: Diagnosis not present

## 2019-12-26 DIAGNOSIS — E785 Hyperlipidemia, unspecified: Secondary | ICD-10-CM | POA: Diagnosis not present

## 2019-12-26 DIAGNOSIS — Z79899 Other long term (current) drug therapy: Secondary | ICD-10-CM | POA: Diagnosis not present

## 2020-01-02 DIAGNOSIS — E785 Hyperlipidemia, unspecified: Secondary | ICD-10-CM | POA: Diagnosis not present

## 2020-01-02 DIAGNOSIS — I700 Atherosclerosis of aorta: Secondary | ICD-10-CM | POA: Diagnosis not present

## 2020-01-02 DIAGNOSIS — I10 Essential (primary) hypertension: Secondary | ICD-10-CM | POA: Diagnosis not present

## 2020-01-03 ENCOUNTER — Encounter: Payer: Self-pay | Admitting: General Surgery

## 2020-01-03 ENCOUNTER — Other Ambulatory Visit: Payer: Self-pay

## 2020-01-03 ENCOUNTER — Ambulatory Visit: Payer: BC Managed Care – PPO | Admitting: General Surgery

## 2020-01-03 VITALS — BP 120/77 | HR 74 | Temp 98.1°F | Resp 12 | Ht 65.0 in | Wt 141.0 lb

## 2020-01-03 DIAGNOSIS — Z1211 Encounter for screening for malignant neoplasm of colon: Secondary | ICD-10-CM

## 2020-01-03 NOTE — Progress Notes (Signed)
Abbee Cremeens [place]; [id number]; Jan 08, 1956   HPI Patient is a 64 year old white female who was referred to my care by Dr. Asencion Noble for a follow-up colonoscopy.  Patient last had a colonoscopy in 2016 and was found to have a tubular adenoma in the cecum.  Since that time, she has had no weight loss, abdominal pain, abnormal diarrhea or constipation, or blood per rectum.  She currently has 0 out of 10 abdominal pain. Past Medical History:  Diagnosis Date  . Bartholin cyst   . Dyslipidemia 06/10/2016   Will recheck fasting  . Enlarged thyroid   . Hemorrhoids   . Herpes simplex without mention of complication   . Hypertension   . Incidental lung nodule, > 43mm and < 21mm 09/10/2018   6.9 mm nodule in lung repeat low dose CT in 6 months   . Liver disease    hep C  . Mild vitamin D deficiency 06/10/2016    Past Surgical History:  Procedure Laterality Date  . BREAST BIOPSY    . COLONOSCOPY N/A 12/19/2014   Procedure: COLONOSCOPY;  Surgeon: Aviva Signs Md, MD;  Location: [facility];  Service: Gastroenterology;  Laterality: N/A;    Family History  Problem Relation Age of Onset  . Heart disease Mother   . Cancer Father   . Stroke Father   . Heart disease Maternal Aunt   . Heart disease Maternal Uncle   . Cancer Paternal Aunt   . Cancer Paternal Uncle   . Heart disease Paternal Grandfather   . Heart disease Paternal Grandmother   . Heart disease Maternal Grandmother   . Heart disease Maternal Grandfather     Current Outpatient Medications on File Prior to Visit  Medication Sig Dispense Refill  . acetaminophen (TYLENOL) 500 MG tablet Take 500 mg by mouth every 6 (six) hours as needed for pain.    Marland Kitchen amLODipine (NORVASC) 10 MG tablet TAKE 1 TABLET BY MOUTH ONCE DAILY. 90 tablet 3  . calcipotriene (DOVONOX) 0.005 % cream Apply topically 2 (two) times daily. 60 g 0  . Cholecalciferol 5000 units capsule Take 1 capsule (5,000 Units total) by mouth daily.    . NON FORMULARY Milk thistle    .  nystatin-triamcinolone ointment (MYCOLOG) Apply 1 application topically 2 (two) times daily. 30 g 1  . valACYclovir (VALTREX) 1000 MG tablet TAKE 1 TABLET(1000 MG) BY MOUTH DAILY 30 tablet 12  . varenicline (CHANTIX STARTING MONTH PAK) 0.5 MG X 11 & 1 MG X 42 tablet FOLLOW PACKAGE DIRECTIONS 53 tablet 0   No current facility-administered medications on file prior to visit.    Allergies  Allergen Reactions  . Penicillins Rash    Social History   Substance and Sexual Activity  Alcohol Use Yes  . Alcohol/week: 12.0 standard drinks  . Types: 12 Cans of beer per week   Comment: 3-4 beers per day; glass of wine daily    Social History   Tobacco Use  Smoking Status Current Every Day Smoker  . Packs/day: 0.50  . Years: 30.00  . Pack years: 15.00  . Types: Cigarettes  Smokeless Tobacco Never Used    Review of Systems  Constitutional: Negative.   HENT: Positive for sinus pain.   Eyes: Negative.   Respiratory: Positive for cough.   Cardiovascular: Negative.   Gastrointestinal: Negative.   Genitourinary: Negative.   Musculoskeletal: Negative.   Skin: Negative.   Neurological: Negative.   Endo/Heme/Allergies: Negative.   Psychiatric/Behavioral: Negative.  Objective   Vitals:   01/03/20 1335  BP: 120/77  Pulse: 74  Resp: 12  Temp: 98.1 F (36.7 C)  SpO2: 97%    Physical Exam Vitals reviewed.  Constitutional:      Appearance: Normal appearance. She is normal weight. She is not ill-appearing.  HENT:     Head: Normocephalic and atraumatic.  Cardiovascular:     Rate and Rhythm: Normal rate and regular rhythm.     Heart sounds: Normal heart sounds. No murmur. No friction rub. No gallop.   Pulmonary:     Effort: Pulmonary effort is normal. No respiratory distress.     Breath sounds: Normal breath sounds. No stridor. No wheezing, rhonchi or rales.  Abdominal:     General: Bowel sounds are normal. There is no distension.     Palpations: Abdomen is soft. There is  no mass.     Tenderness: There is no abdominal tenderness. There is no guarding or rebound.     Hernia: No hernia is present.  Skin:    General: Skin is warm and dry.  Neurological:     General: No focal deficit present.     Mental Status: She is alert and oriented to person, place, and time.    Previous colonoscopy report and pathology reviewed Assessment  History of colon polyp, need for screening colonoscopy Plan   Patient will call to schedule her colonoscopy this summer.  The risks and benefits of the procedure including bleeding and perforation were fully explained to the patient, who gave informed consent.  Suprep will be prescribed.

## 2020-01-03 NOTE — Patient Instructions (Signed)
Colonoscopy, Adult A colonoscopy is a procedure to look at the entire large intestine. This procedure is done using a long, thin, flexible tube that has a camera on the end. You may have a colonoscopy:  As a part of normal colorectal screening.  If you have certain symptoms, such as: ? A low number of red blood cells in your blood (anemia). ? Diarrhea that does not go away. ? Pain in your abdomen. ? Blood in your stool. A colonoscopy can help screen for and diagnose medical problems, including:  Tumors.  Extra tissue that grows where mucus forms (polyps).  Inflammation.  Areas of bleeding. Tell your health care provider about:  Any allergies you have.  All medicines you are taking, including vitamins, herbs, eye drops, creams, and over-the-counter medicines.  Any problems you or family members have had with anesthetic medicines.  Any blood disorders you have.  Any surgeries you have had.  Any medical conditions you have.  Any problems you have had with having bowel movements.  Whether you are pregnant or may be pregnant. What are the risks? Generally, this is a safe procedure. However, problems may occur, including:  Bleeding.  Damage to your intestine.  Allergic reactions to medicines given during the procedure.  Infection. This is rare. What happens before the procedure? Eating and drinking restrictions Follow instructions from your health care provider about eating or drinking restrictions, which may include:  A few days before the procedure: ? Follow a low-fiber diet. ? Avoid nuts, seeds, dried fruit, raw fruits, and vegetables.  1-3 days before the procedure: ? Eat only gelatin dessert or ice pops. ? Drink only clear liquids, such as water, clear juice, clear broth or bouillon, black coffee or tea, or clear soft drinks or sports drinks. ? Avoid liquids that contain red or purple dye. Bowel prep If you were prescribed a bowel prep to take by mouth  (orally) to clean out your colon:  Take it as told by your health care provider. Starting the day before your procedure, you will need to drink a large amount of liquid medicine. The liquid will cause you to have many bowel movements of loose stool until your stool becomes almost clear or light green.  If your skin or the opening between the buttocks (anus) gets irritated from diarrhea, you may relieve the irritation using: ? Wipes with medicine in them, such as adult wet wipes with aloe and vitamin E. ? A product to soothe skin, such as petroleum jelly.  If you vomit while drinking the bowel prep: ? Take a break for up to 60 minutes. ? Begin the bowel prep again. ? Call your health care provider if you keep vomiting or you cannot take the bowel prep without vomiting.  To clean out your colon, you may also be given: ? Laxative medicines. These help you have a bowel movement. ? Instructions for enema use. An enema is liquid medicine injected into your rectum. Medicines Ask your health care provider about:  Changing or stopping your regular medicines or supplements. This is especially important if you are taking iron supplements, diabetes medicines, or blood thinners.  Taking medicines such as aspirin and ibuprofen. These medicines can thin your blood. Do not take these medicines unless your health care provider tells you to take them.  Taking over-the-counter medicines, vitamins, herbs, and supplements. General instructions  Ask your health care provider what steps will be taken to help prevent infection. These may include washing skin with a   with a germ-killing soap.  Plan to have someone take you home from the hospital or clinic. What happens during the procedure?   An IV will be inserted into one of your veins.  You may be given one or more of the following: ? A medicine to help you relax (sedative). ? A medicine to numb the area (local anesthetic). ? A medicine to make you fall  asleep (general anesthetic). This is rarely needed.  You will lie on your side with your knees bent.  The tube will: ? Have oil or gel put on it (be lubricated). ? Be inserted into your anus. ? Be gently eased through all parts of your large intestine.  Air will be sent into your colon to keep it open. This may cause some pressure or cramping.  Images will be taken with the camera and will appear on a screen.  A small tissue sample may be removed to be looked at under a microscope (biopsy). The tissue may be sent to a lab for testing if any signs of problems are found.  If small polyps are found, they may be removed and checked for cancer cells.  When the procedure is finished, the tube will be removed. The procedure may vary among health care providers and hospitals. What happens after the procedure?  Your blood pressure, heart rate, breathing rate, and blood oxygen level will be monitored until you leave the hospital or clinic.  You may have a small amount of blood in your stool.  You may pass gas and have mild cramping or bloating in your abdomen. This is caused by the air that was used to open your colon during the exam.  Do not drive for 24 hours after the procedure.  It is up to you to get the results of your procedure. Ask your health care provider, or the department that is doing the procedure, when your results will be ready. Summary  A colonoscopy is a procedure to look at the entire large intestine.  Follow instructions from your health care provider about eating and drinking before the procedure.  If you were prescribed an oral bowel prep to clean out your colon, take it as told by your health care provider.  During the colonoscopy, a flexible tube with a camera on its end is inserted into the anus and then passed into the other parts of the large intestine. This information is not intended to replace advice given to you by your health care provider. Make sure you  discuss any questions you have with your health care provider. Document Revised: 04/01/2019 Document Reviewed: 04/01/2019 Elsevier Patient Education  2020 [employer]  

## 2020-05-01 ENCOUNTER — Ambulatory Visit: Payer: BC Managed Care – PPO | Admitting: General Surgery

## 2020-05-21 ENCOUNTER — Other Ambulatory Visit: Payer: Self-pay | Admitting: Adult Health

## 2020-05-22 ENCOUNTER — Other Ambulatory Visit: Payer: Self-pay

## 2020-05-22 ENCOUNTER — Ambulatory Visit (INDEPENDENT_AMBULATORY_CARE_PROVIDER_SITE_OTHER): Payer: Medicare Other | Admitting: General Surgery

## 2020-05-22 ENCOUNTER — Encounter: Payer: Self-pay | Admitting: General Surgery

## 2020-05-22 VITALS — BP 118/78 | HR 70 | Temp 98.9°F | Resp 16 | Ht 65.0 in | Wt 140.0 lb

## 2020-05-22 DIAGNOSIS — Z1211 Encounter for screening for malignant neoplasm of colon: Secondary | ICD-10-CM

## 2020-05-22 MED ORDER — SUPREP BOWEL PREP KIT 17.5-3.13-1.6 GM/177ML PO SOLN: 1.0000 | Freq: Once | ORAL | 0 refills | Status: AC

## 2020-05-22 NOTE — Progress Notes (Signed)
Subjective:     Julia Marsh  Patient presents back to schedule her colonoscopy.  She has new insurance and now wants to proceed with her colonoscopy. Objective:    BP 118/78   Pulse 70   Temp 98.9 F (37.2 C) (Oral)   Resp 16   Ht 5\' 5"  (1.651 m)   Wt 140 lb (63.5 kg)   SpO2 96%   BMI 23.30 kg/m   General:  alert, cooperative and no distress       Assessment:    Need for screening colonoscopy    Plan:   Patient is scheduled for colonoscopy on 06/05/2020.  The risks and benefits of the procedure including bleeding and perforation were fully explained to the patient, who gave informed consent.  Suprep has been prescribed.

## 2020-05-22 NOTE — Patient Instructions (Signed)
Colonoscopy, Adult A colonoscopy is a procedure to look at the entire large intestine. This procedure is done using a long, thin, flexible tube that has a camera on the end. You may have a colonoscopy:  As a part of normal colorectal screening.  If you have certain symptoms, such as: ? A low number of red blood cells in your blood (anemia). ? Diarrhea that does not go away. ? Pain in your abdomen. ? Blood in your stool. A colonoscopy can help screen for and diagnose medical problems, including:  Tumors.  Extra tissue that grows where mucus forms (polyps).  Inflammation.  Areas of bleeding. Tell your health care provider about:  Any allergies you have.  All medicines you are taking, including vitamins, herbs, eye drops, creams, and over-the-counter medicines.  Any problems you or family members have had with anesthetic medicines.  Any blood disorders you have.  Any surgeries you have had.  Any medical conditions you have.  Any problems you have had with having bowel movements.  Whether you are pregnant or may be pregnant. What are the risks? Generally, this is a safe procedure. However, problems may occur, including:  Bleeding.  Damage to your intestine.  Allergic reactions to medicines given during the procedure.  Infection. This is rare. What happens before the procedure? Eating and drinking restrictions Follow instructions from your health care provider about eating or drinking restrictions, which may include:  A few days before the procedure: ? Follow a low-fiber diet. ? Avoid nuts, seeds, dried fruit, raw fruits, and vegetables.  1-3 days before the procedure: ? Eat only gelatin dessert or ice pops. ? Drink only clear liquids, such as water, clear juice, clear broth or bouillon, black coffee or tea, or clear soft drinks or sports drinks. ? Avoid liquids that contain red or purple dye.  The day of the procedure: ? Do not eat solid foods. You may  continue to drink clear liquids until up to 2 hours before the procedure. ? Do not eat or drink anything starting 2 hours before the procedure, or within the time period that your health care provider recommends. Bowel prep If you were prescribed a bowel prep to take by mouth (orally) to clean out your colon:  Take it as told by your health care provider. Starting the day before your procedure, you will need to drink a large amount of liquid medicine. The liquid will cause you to have many bowel movements of loose stool until your stool becomes almost clear or light green.  If your skin or the opening between the buttocks (anus) gets irritated from diarrhea, you may relieve the irritation using: ? Wipes with medicine in them, such as adult wet wipes with aloe and vitamin E. ? A product to soothe skin, such as petroleum jelly.  If you vomit while drinking the bowel prep: ? Take a break for up to 60 minutes. ? Begin the bowel prep again. ? Call your health care provider if you keep vomiting or you cannot take the bowel prep without vomiting.  To clean out your colon, you may also be given: ? Laxative medicines. These help you have a bowel movement. ? Instructions for enema use. An enema is liquid medicine injected into your rectum. Medicines Ask your health care provider about:  Changing or stopping your regular medicines or supplements. This is especially important if you are taking iron supplements, diabetes medicines, or blood thinners.  Taking medicines such as aspirin and ibuprofen. These medicines  can thin your blood. Do not take these medicines unless your health care provider tells you to take them.  Taking over-the-counter medicines, vitamins, herbs, and supplements. General instructions  Ask your health care provider what steps will be taken to help prevent infection. These may include washing skin with a germ-killing soap.  Plan to have someone take you home from the hospital  or clinic. What happens during the procedure?   An IV will be inserted into one of your veins.  You may be given one or more of the following: ? A medicine to help you relax (sedative). ? A medicine to numb the area (local anesthetic). ? A medicine to make you fall asleep (general anesthetic). This is rarely needed.  You will lie on your side with your knees bent.  The tube will: ? Have oil or gel put on it (be lubricated). ? Be inserted into your anus. ? Be gently eased through all parts of your large intestine.  Air will be sent into your colon to keep it open. This may cause some pressure or cramping.  Images will be taken with the camera and will appear on a screen.  A small tissue sample may be removed to be looked at under a microscope (biopsy). The tissue may be sent to a lab for testing if any signs of problems are found.  If small polyps are found, they may be removed and checked for cancer cells.  When the procedure is finished, the tube will be removed. The procedure may vary among health care providers and hospitals. What happens after the procedure?  Your blood pressure, heart rate, breathing rate, and blood oxygen level will be monitored until you leave the hospital or clinic.  You may have a small amount of blood in your stool.  You may pass gas and have mild cramping or bloating in your abdomen. This is caused by the air that was used to open your colon during the exam.  Do not drive for 24 hours after the procedure.  It is up to you to get the results of your procedure. Ask your health care provider, or the department that is doing the procedure, when your results will be ready. Summary  A colonoscopy is a procedure to look at the entire large intestine.  Follow instructions from your health care provider about eating and drinking before the procedure.  If you were prescribed an oral bowel prep to clean out your colon, take it as told by your health care  provider.  During the colonoscopy, a flexible tube with a camera on its end is inserted into the anus and then passed into the other parts of the large intestine. This information is not intended to replace advice given to you by your health care provider. Make sure you discuss any questions you have with your health care provider. Document Revised: 04/01/2019 Document Reviewed: 04/01/2019 Elsevier Patient Education  2020 Elsevier Inc.  

## 2020-05-22 NOTE — H&P (Signed)
Julia Marsh [place]; [id number]; Jan 08, 1956   HPI Patient is a 64 year old white female who was referred to my care by Dr. Asencion Noble for a follow-up colonoscopy.  Patient last had a colonoscopy in 2016 and was found to have a tubular adenoma in the cecum.  Since that time, she has had no weight loss, abdominal pain, abnormal diarrhea or constipation, or blood per rectum.  She currently has 0 out of 10 abdominal pain. Past Medical History:  Diagnosis Date  . Bartholin cyst   . Dyslipidemia 06/10/2016   Will recheck fasting  . Enlarged thyroid   . Hemorrhoids   . Herpes simplex without mention of complication   . Hypertension   . Incidental lung nodule, > 43mm and < 21mm 09/10/2018   6.9 mm nodule in lung repeat low dose CT in 6 months   . Liver disease    hep C  . Mild vitamin D deficiency 06/10/2016    Past Surgical History:  Procedure Laterality Date  . BREAST BIOPSY    . COLONOSCOPY N/A 12/19/2014   Procedure: COLONOSCOPY;  Surgeon: Aviva Signs Md, MD;  Location: [facility];  Service: Gastroenterology;  Laterality: N/A;    Family History  Problem Relation Age of Onset  . Heart disease Mother   . Cancer Father   . Stroke Father   . Heart disease Maternal Aunt   . Heart disease Maternal Uncle   . Cancer Paternal Aunt   . Cancer Paternal Uncle   . Heart disease Paternal Grandfather   . Heart disease Paternal Grandmother   . Heart disease Maternal Grandmother   . Heart disease Maternal Grandfather     Current Outpatient Medications on File Prior to Visit  Medication Sig Dispense Refill  . acetaminophen (TYLENOL) 500 MG tablet Take 500 mg by mouth every 6 (six) hours as needed for pain.    Marland Kitchen amLODipine (NORVASC) 10 MG tablet TAKE 1 TABLET BY MOUTH ONCE DAILY. 90 tablet 3  . calcipotriene (DOVONOX) 0.005 % cream Apply topically 2 (two) times daily. 60 g 0  . Cholecalciferol 5000 units capsule Take 1 capsule (5,000 Units total) by mouth daily.    . NON FORMULARY Milk thistle    .  nystatin-triamcinolone ointment (MYCOLOG) Apply 1 application topically 2 (two) times daily. 30 g 1  . valACYclovir (VALTREX) 1000 MG tablet TAKE 1 TABLET(1000 MG) BY MOUTH DAILY 30 tablet 12  . varenicline (CHANTIX STARTING MONTH PAK) 0.5 MG X 11 & 1 MG X 42 tablet FOLLOW PACKAGE DIRECTIONS 53 tablet 0   No current facility-administered medications on file prior to visit.    Allergies  Allergen Reactions  . Penicillins Rash    Social History   Substance and Sexual Activity  Alcohol Use Yes  . Alcohol/week: 12.0 standard drinks  . Types: 12 Cans of beer per week   Comment: 3-4 beers per day; glass of wine daily    Social History   Tobacco Use  Smoking Status Current Every Day Smoker  . Packs/day: 0.50  . Years: 30.00  . Pack years: 15.00  . Types: Cigarettes  Smokeless Tobacco Never Used    Review of Systems  Constitutional: Negative.   HENT: Positive for sinus pain.   Eyes: Negative.   Respiratory: Positive for cough.   Cardiovascular: Negative.   Gastrointestinal: Negative.   Genitourinary: Negative.   Musculoskeletal: Negative.   Skin: Negative.   Neurological: Negative.   Endo/Heme/Allergies: Negative.   Psychiatric/Behavioral: Negative.  Objective   Vitals:   01/03/20 1335  BP: 120/77  Pulse: 74  Resp: 12  Temp: 98.1 F (36.7 C)  SpO2: 97%    Physical Exam Vitals reviewed.  Constitutional:      Appearance: Normal appearance. She is normal weight. She is not ill-appearing.  HENT:     Head: Normocephalic and atraumatic.  Cardiovascular:     Rate and Rhythm: Normal rate and regular rhythm.     Heart sounds: Normal heart sounds. No murmur. No friction rub. No gallop.   Pulmonary:     Effort: Pulmonary effort is normal. No respiratory distress.     Breath sounds: Normal breath sounds. No stridor. No wheezing, rhonchi or rales.  Abdominal:     General: Bowel sounds are normal. There is no distension.     Palpations: Abdomen is soft. There is  no mass.     Tenderness: There is no abdominal tenderness. There is no guarding or rebound.     Hernia: No hernia is present.  Skin:    General: Skin is warm and dry.  Neurological:     General: No focal deficit present.     Mental Status: She is alert and oriented to person, place, and time.    Previous colonoscopy report and pathology reviewed Assessment  History of colon polyp, need for screening colonoscopy Plan   Patient will call to schedule her colonoscopy this summer.  The risks and benefits of the procedure including bleeding and perforation were fully explained to the patient, who gave informed consent.  Suprep will be prescribed. Scheduled for 06/05/20.

## 2020-06-01 ENCOUNTER — Other Ambulatory Visit (HOSPITAL_COMMUNITY)
Admission: RE | Admit: 2020-06-01 | Discharge: 2020-06-01 | Disposition: A | Discharge status: Home / Self Care | Payer: Medicare Other | Source: Ambulatory Visit | Attending: General Surgery | Admitting: General Surgery

## 2020-06-01 ENCOUNTER — Other Ambulatory Visit: Payer: Self-pay

## 2020-06-01 DIAGNOSIS — Z01812 Encounter for preprocedural laboratory examination: Secondary | ICD-10-CM | POA: Diagnosis present

## 2020-06-01 DIAGNOSIS — Z20822 Contact with and (suspected) exposure to covid-19: Secondary | ICD-10-CM | POA: Insufficient documentation

## 2020-06-02 LAB — SARS CORONAVIRUS 2 (TAT 6-24 HRS): SARS Coronavirus 2: NEGATIVE

## 2020-06-05 ENCOUNTER — Other Ambulatory Visit: Payer: Self-pay

## 2020-06-05 ENCOUNTER — Ambulatory Visit (HOSPITAL_COMMUNITY)
Admission: RE | Admit: 2020-06-05 | Discharge: 2020-06-05 | Disposition: A | Discharge status: Home / Self Care | Payer: Medicare Other | Attending: General Surgery | Admitting: General Surgery

## 2020-06-05 ENCOUNTER — Encounter (HOSPITAL_COMMUNITY): Payer: Self-pay | Admitting: General Surgery

## 2020-06-05 ENCOUNTER — Encounter (HOSPITAL_COMMUNITY)
Admission: RE | Disposition: A | Discharge status: Home / Self Care | Payer: Self-pay | Source: Home / Self Care | Attending: General Surgery

## 2020-06-05 DIAGNOSIS — F17210 Nicotine dependence, cigarettes, uncomplicated: Secondary | ICD-10-CM | POA: Insufficient documentation

## 2020-06-05 DIAGNOSIS — I10 Essential (primary) hypertension: Secondary | ICD-10-CM | POA: Diagnosis not present

## 2020-06-05 DIAGNOSIS — E559 Vitamin D deficiency, unspecified: Secondary | ICD-10-CM | POA: Diagnosis not present

## 2020-06-05 DIAGNOSIS — Z79899 Other long term (current) drug therapy: Secondary | ICD-10-CM | POA: Insufficient documentation

## 2020-06-05 DIAGNOSIS — Z1211 Encounter for screening for malignant neoplasm of colon: Secondary | ICD-10-CM

## 2020-06-05 DIAGNOSIS — Z86010 Personal history of colonic polyps: Secondary | ICD-10-CM | POA: Diagnosis not present

## 2020-06-05 DIAGNOSIS — Z880 Allergy status to penicillin: Secondary | ICD-10-CM | POA: Insufficient documentation

## 2020-06-05 HISTORY — PX: COLONOSCOPY: SHX5424

## 2020-06-05 SURGERY — COLONOSCOPY
Anesthesia: Moderate Sedation

## 2020-06-05 MED ORDER — MIDAZOLAM HCL 5 MG/5ML IJ SOLN
INTRAMUSCULAR | Status: DC | PRN
  Administered 2020-06-05: 1 mg via INTRAVENOUS
  Administered 2020-06-05: 3 mg via INTRAVENOUS

## 2020-06-05 MED ORDER — SODIUM CHLORIDE 0.9 % IV SOLN: INTRAVENOUS | Status: DC

## 2020-06-05 MED ORDER — MEPERIDINE HCL 50 MG/ML IJ SOLN
INTRAMUSCULAR | Status: DC | PRN
  Administered 2020-06-05: 50 mg via INTRAVENOUS

## 2020-06-05 MED ORDER — MIDAZOLAM HCL 5 MG/5ML IJ SOLN
INTRAMUSCULAR | Status: AC
  Filled 2020-06-05: qty 10

## 2020-06-05 MED ORDER — MEPERIDINE HCL 50 MG/ML IJ SOLN
INTRAMUSCULAR | Status: AC
  Filled 2020-06-05: qty 1

## 2020-06-05 MED ORDER — STERILE WATER FOR IRRIGATION IR SOLN
Status: DC | PRN
  Administered 2020-06-05: 1.5 mL

## 2020-06-05 NOTE — Discharge Instructions (Signed)
Colonoscopy, Adult, Care After This sheet gives you information about how to care for yourself after your procedure. Your doctor may also give you more specific instructions. If you have problems or questions, call your doctor. What can I expect after the procedure? After the procedure, it is common to have:  A small amount of blood in your poop (stool) for 24 hours.  Some gas.  Mild cramping or bloating in your belly (abdomen). Follow these instructions at home: Eating and drinking   Drink enough fluid to keep your pee (urine) pale yellow.  Follow instructions from your doctor about what you cannot eat or drink.  Return to your normal diet as told by your doctor. Avoid heavy or fried foods that are hard to digest. Activity  Rest as told by your doctor.  Do not sit for a long time without moving. Get up to take short walks every 1-2 hours. This is important. Ask for help if you feel weak or unsteady.  Return to your normal activities as told by your doctor. Ask your doctor what activities are safe for you. To help cramping and bloating:   Try walking around.  Put heat on your belly as told by your doctor. Use the heat source that your doctor recommends, such as a moist heat pack or a heating pad. ? Put a towel between your skin and the heat source. ? Leave the heat on for 20-30 minutes. ? Remove the heat if your skin turns bright red. This is very important if you are unable to feel pain, heat, or cold. You may have a greater risk of getting burned. General instructions  For the first 24 hours after the procedure: ? Do not drive or use machinery. ? Do not sign important documents. ? Do not drink alcohol. ? Do your daily activities more slowly than normal. ? Eat foods that are soft and easy to digest.  Take over-the-counter or prescription medicines only as told by your doctor.  Keep all follow-up visits as told by your doctor. This is important. Contact a doctor  if:  You have blood in your poop 2-3 days after the procedure. Get help right away if:  You have more than a small amount of blood in your poop.  You see large clumps of tissue (blood clots) in your poop.  Your belly is swollen.  You feel like you may vomit (nauseous).  You vomit.  You have a fever.  You have belly pain that gets worse, and medicine does not help your pain. Summary  After the procedure, it is common to have a small amount of blood in your poop. You may also have mild cramping and bloating in your belly.  For the first 24 hours after the procedure, do not drive or use machinery, do not sign important documents, and do not drink alcohol.  Get help right away if you have a lot of blood in your poop, feel like you may vomit, have a fever, or have more belly pain. This information is not intended to replace advice given to you by your health care provider. Make sure you discuss any questions you have with your health care provider. Document Revised: 04/04/2019 Document Reviewed: 04/04/2019 Elsevier Patient Education  2020 Elsevier Inc.  

## 2020-06-05 NOTE — Interval H&P Note (Signed)
History and Physical Interval Note:  06/05/2020 7:18 AM  Julia Marsh  has presented today for surgery, with the diagnosis of Screening.  The various methods of treatment have been discussed with the patient and family. After consideration of risks, benefits and other options for treatment, the patient has consented to  Procedure(s): COLONOSCOPY (N/A) as a surgical intervention.  The patient's history has been reviewed, patient examined, no change in status, stable for surgery.  I have reviewed the patient's chart and labs.  Questions were answered to the patient's satisfaction.     Aviva Signs

## 2020-06-05 NOTE — Op Note (Signed)
[facility] Patient Name: Julia Marsh Procedure Date: 06/05/2020 7:18 AM MRN: [id number] Date of Birth: November 02, 1955 Attending MD: Aviva Signs , MD CSN: [id number] Age: 64 Admit Type: Outpatient Procedure:                Colonoscopy Indications:              Screening for colorectal malignant neoplasm Providers:                Aviva Signs, MD, Charlsie Quest. Theda Sers RN, RN, Janeece Riggers, RN Referring MD:              Medicines:                Midazolam 4 mg IV, Meperidine 50 mg IV Complications:            No immediate complications. Estimated Blood Loss:     Estimated blood loss: none. Procedure:                Pre-Anesthesia Assessment:                           - Prior to the procedure, a History and Physical                            was performed, and patient medications and                            allergies were reviewed. The patient is competent.                            The risks and benefits of the procedure and the                            sedation options and risks were discussed with the                            patient. All questions were answered and informed                            consent was obtained. Patient identification and                            proposed procedure were verified by the physician,                            the nurse and the technician in the endoscopy                            suite. Mental Status Examination: alert and                            oriented. Airway Examination: normal oropharyngeal  airway and neck mobility. Respiratory Examination:                            clear to auscultation. CV Examination: RRR, no                            murmurs, no S3 or S4. Prophylactic Antibiotics: The                            patient does not require prophylactic antibiotics.                            Prior Anticoagulants: The patient has taken no                             previous anticoagulant or antiplatelet agents. ASA                            Grade Assessment: II - A patient with mild systemic                            disease. After reviewing the risks and benefits,                            the patient was deemed in satisfactory condition to                            undergo the procedure. The anesthesia plan was to                            use moderate sedation / analgesia (conscious                            sedation). Immediately prior to administration of                            medications, the patient was re-assessed for                            adequacy to receive sedatives. The heart rate,                            respiratory rate, oxygen saturations, blood                            pressure, adequacy of pulmonary ventilation, and                            response to care were monitored throughout the                            procedure. The physical status of the patient was  re-assessed after the procedure.                           After obtaining informed consent, the colonoscope                            was passed under direct vision. Throughout the                            procedure, the patient's blood pressure, pulse, and                            oxygen saturations were monitored continuously. The                            CF-[id number] ([id number]) scope was introduced through                            the anus and advanced to the the cecum, identified                            by the appendiceal orifice, ileocecal valve and                            palpation. No anatomical landmarks were                            photographed. The entire colon was examined. The                            colonoscopy was performed without difficulty. The                            patient tolerated the procedure well. The quality                            of the bowel preparation was adequate. The total                             duration of the procedure was 15 minutes. Scope In: 7:23:31 AM Scope Out: 7:35:58 AM Scope Withdrawal Time: 0 hours 4 minutes 7 seconds  Total Procedure Duration: 0 hours 12 minutes 27 seconds  Findings:      The entire examined colon appeared normal on direct and retroflexion       views. Impression:               - The entire examined colon is normal on direct and                            retroflexion views.                           - No specimens collected. Moderate Sedation:      Moderate (conscious) sedation was administered by the endoscopy nurse  and supervised by the endoscopist. The following parameters were       monitored: oxygen saturation, heart rate, blood pressure, and response       to care. Recommendation:           - Written discharge instructions were provided to                            the patient.                           - The signs and symptoms of potential delayed                            complications were discussed with the patient.                           - Patient has a contact number available for                            emergencies.                           - Return to normal activities tomorrow.                           - Resume previous diet.                           - Continue present medications.                           - Repeat colonoscopy in 10 years for screening                            purposes. Procedure Code(s):        --- Professional ---                           [id number], Colonoscopy, flexible; diagnostic, including                            collection of specimen(s) by brushing or washing,                            when performed (separate procedure) Diagnosis Code(s):        --- Professional ---                           Z12.11, Encounter for screening for malignant                            neoplasm of colon CPT copyright 2019 American Medical Association. All rights reserved. The  codes documented in this report are preliminary and upon coder review may  be revised to meet current compliance requirements. Aviva Signs, MD Aviva Signs, MD 06/05/2020 7:44:40 AM This report has been signed electronically. Number of Addenda: 0

## 2020-06-08 ENCOUNTER — Encounter (HOSPITAL_COMMUNITY): Payer: Self-pay | Admitting: General Surgery

## 2020-06-13 ENCOUNTER — Other Ambulatory Visit (HOSPITAL_COMMUNITY): Payer: Self-pay | Admitting: Internal Medicine

## 2020-06-13 DIAGNOSIS — Z1231 Encounter for screening mammogram for malignant neoplasm of breast: Secondary | ICD-10-CM

## 2020-07-12 ENCOUNTER — Ambulatory Visit (HOSPITAL_COMMUNITY)
Admission: RE | Admit: 2020-07-12 | Discharge: 2020-07-12 | Disposition: A | Discharge status: Home / Self Care | Payer: Medicare Other | Source: Ambulatory Visit | Attending: Internal Medicine | Admitting: Internal Medicine

## 2020-07-12 ENCOUNTER — Ambulatory Visit (INDEPENDENT_AMBULATORY_CARE_PROVIDER_SITE_OTHER): Payer: Medicare Other | Admitting: Adult Health

## 2020-07-12 ENCOUNTER — Encounter: Payer: Self-pay | Admitting: Adult Health

## 2020-07-12 ENCOUNTER — Other Ambulatory Visit: Payer: Self-pay

## 2020-07-12 VITALS — BP 138/88 | HR 73 | Ht 63.75 in | Wt 142.0 lb

## 2020-07-12 DIAGNOSIS — Z01419 Encounter for gynecological examination (general) (routine) without abnormal findings: Secondary | ICD-10-CM

## 2020-07-12 DIAGNOSIS — F17200 Nicotine dependence, unspecified, uncomplicated: Secondary | ICD-10-CM

## 2020-07-12 DIAGNOSIS — I10 Essential (primary) hypertension: Secondary | ICD-10-CM

## 2020-07-12 DIAGNOSIS — Z1231 Encounter for screening mammogram for malignant neoplasm of breast: Secondary | ICD-10-CM | POA: Diagnosis present

## 2020-07-12 DIAGNOSIS — Z8619 Personal history of other infectious and parasitic diseases: Secondary | ICD-10-CM

## 2020-07-12 DIAGNOSIS — E785 Hyperlipidemia, unspecified: Secondary | ICD-10-CM

## 2020-07-12 DIAGNOSIS — R911 Solitary pulmonary nodule: Secondary | ICD-10-CM

## 2020-07-12 NOTE — Progress Notes (Signed)
Patient ID: Julia Marsh, female   DOB: 12-01-1955, 64 y.o.   MRN: [id number] History of Present Illness: Julia Marsh is a 64 year old white female,widowed, PM in for a well woman gyn exam, she had a normal pap 06/24/09 with negative HPV. She had colonoscopy 06/05/20 and mammogram this morning. She had chest CT in February.  She active, mows grass and [place]. PCP is Dr Willey Blade.   Current Medications, Allergies, Past Medical History, Past Surgical History, Family History and Social History were reviewed in [employer] record.     Review of Systems: Patient denies any headaches, hearing loss, fatigue, blurred vision, shortness of breath, chest pain, abdominal pain, problems with bowel movements, urination, or intercourse. No mood swings. She is taking statin and has joint pain.   Physical Exam:BP 138/88 (BP Location: Left Arm, Patient Position: Sitting, Cuff Size: Normal)   Pulse 73   Ht 5' 3.75" (1.619 m)   Wt 142 lb (64.4 kg)   BMI 24.57 kg/m  General:  Well developed, well nourished, no acute distress Skin:  Warm and dry Neck:  Midline trachea,enlarged  thyroid, good ROM, no lymphadenopathy,no carotid bruits heard Lungs; Clear to auscultation bilaterally Breast:  No dominant palpable mass, retraction, or nipple discharge,tender outer quadrant Cardiovascular: Regular rate and rhythm Abdomen:  Soft, non tender, no hepatosplenomegaly Pelvic:  External genitalia is normal in appearance, no lesions.  The vagina is pale with loss of moisture. Urethra has no lesions or masses. The cervix is smooth.  Uterus is felt to be normal size, shape, and contour.  No adnexal masses or tenderness noted.Bladder is non tender, no masses felt. Rectal: Deferred, had colonoscopy 06/05/20 Extremities/musculoskeletal:  No swelling or varicosities noted, no clubbing or cyanosis Psych:  No mood changes, alert and cooperative,seems happy,but teary friend had a stroke and  not doing well AA is 8, advised to decrease  Fall risk is low PHQ 9 score is 4   Upstream - 07/12/20 1043      Pregnancy Intention Screening   Does the patient want to become pregnant in the next year? N/A    Does the patient's partner want to become pregnant in the next year? N/A    Would the patient like to discuss contraceptive options today? N/A      Contraception Wrap Up   Current Method No Method - Other Reason   postmenopausal   End Method No Method - Other Reason   postmenopausal   Contraception Counseling Provided No         Examination chaperoned by Levy Pupa  Impression and Plan:  1. Encounter for well woman exam with routine gynecological exam Pap and physical in 1 year Mammogram yearly Labs with PCP,had some today Colonoscopy per Dr Arnoldo Morale CT of chest yearly She has had both COVID vaccines Get flu shot   2. Smoker Trying to decrease She said chantix was pulled   3. History of hepatitis C  4. Essential hypertension On norvasc  5. Dyslipidemia On crestor   6. Incidental lung nodule, > 62mm and < 52mm CT yearly

## 2020-07-19 ENCOUNTER — Other Ambulatory Visit: Payer: Self-pay | Admitting: Adult Health

## 2020-11-05 ENCOUNTER — Other Ambulatory Visit: Payer: Self-pay | Admitting: Internal Medicine

## 2020-11-05 DIAGNOSIS — R911 Solitary pulmonary nodule: Secondary | ICD-10-CM

## 2020-11-29 ENCOUNTER — Ambulatory Visit (HOSPITAL_COMMUNITY)
Admission: RE | Admit: 2020-11-29 | Discharge: 2020-11-29 | Disposition: A | Discharge status: Home / Self Care | Payer: Medicare Other | Source: Ambulatory Visit | Attending: Internal Medicine | Admitting: Internal Medicine

## 2020-11-29 DIAGNOSIS — R911 Solitary pulmonary nodule: Secondary | ICD-10-CM | POA: Insufficient documentation

## 2021-05-03 ENCOUNTER — Other Ambulatory Visit (HOSPITAL_COMMUNITY): Payer: Self-pay | Admitting: Internal Medicine

## 2021-05-03 DIAGNOSIS — Z1231 Encounter for screening mammogram for malignant neoplasm of breast: Secondary | ICD-10-CM

## 2021-06-16 ENCOUNTER — Other Ambulatory Visit: Payer: Self-pay | Admitting: Adult Health

## 2021-07-15 ENCOUNTER — Ambulatory Visit (HOSPITAL_COMMUNITY): Payer: Medicare Other

## 2021-07-15 ENCOUNTER — Other Ambulatory Visit: Payer: Medicare Other | Admitting: Adult Health

## 2021-07-23 ENCOUNTER — Other Ambulatory Visit (HOSPITAL_COMMUNITY)
Admission: RE | Admit: 2021-07-23 | Discharge: 2021-07-23 | Disposition: A | Discharge status: Home / Self Care | Payer: Medicare Other | Source: Ambulatory Visit | Attending: Adult Health | Admitting: Adult Health

## 2021-07-23 ENCOUNTER — Encounter: Payer: Self-pay | Admitting: Obstetrics & Gynecology

## 2021-07-23 ENCOUNTER — Other Ambulatory Visit: Payer: Self-pay | Admitting: Adult Health

## 2021-07-23 ENCOUNTER — Ambulatory Visit (INDEPENDENT_AMBULATORY_CARE_PROVIDER_SITE_OTHER): Payer: Medicare Other | Admitting: Obstetrics & Gynecology

## 2021-07-23 ENCOUNTER — Other Ambulatory Visit: Payer: Self-pay

## 2021-07-23 VITALS — BP 128/82 | HR 65 | Ht 65.5 in | Wt 143.5 lb

## 2021-07-23 DIAGNOSIS — Z1212 Encounter for screening for malignant neoplasm of rectum: Secondary | ICD-10-CM

## 2021-07-23 DIAGNOSIS — Z01419 Encounter for gynecological examination (general) (routine) without abnormal findings: Secondary | ICD-10-CM

## 2021-07-23 DIAGNOSIS — Z1211 Encounter for screening for malignant neoplasm of colon: Secondary | ICD-10-CM | POA: Diagnosis not present

## 2021-07-23 DIAGNOSIS — Z1151 Encounter for screening for human papillomavirus (HPV): Secondary | ICD-10-CM | POA: Insufficient documentation

## 2021-07-23 LAB — HEMOCCULT GUIAC POC 1CARD (OFFICE): Fecal Occult Blood, POC: NEGATIVE

## 2021-07-23 MED ORDER — NYSTATIN-TRIAMCINOLONE 100000-0.1 UNIT/GM-% EX OINT: 1.0000 "application " | TOPICAL_OINTMENT | Freq: Two times a day (BID) | CUTANEOUS | 11 refills | Status: DC

## 2021-07-23 NOTE — Progress Notes (Signed)
Subjective:     Julia Marsh is a 65 y.o. female here for a routine exam.  No LMP recorded. Patient is postmenopausal. [id number] Birth Control Method:  postmenopausal Menstrual Calendar(currently): amenorrheic  Current complaints: none.   Current acute medical issues:  none   Recent Gynecologic History No LMP recorded. Patient is postmenopausal. Last Pap: 2019,  normal Last mammogram: 07/26/21,  scheduled  Past Medical History:  Diagnosis Date   Bartholin cyst    Dyslipidemia 06/10/2016   Will recheck fasting   Enlarged thyroid    Hemorrhoids    Herpes simplex without mention of complication    Hypertension    Incidental lung nodule, > 100mm and < 66mm 09/10/2018   6.9 mm nodule in lung repeat low dose CT in 6 months    Liver disease    hep C   Mild vitamin D deficiency 06/10/2016   Psoriasis     Past Surgical History:  Procedure Laterality Date   BREAST BIOPSY     COLONOSCOPY N/A 12/19/2014   Procedure: COLONOSCOPY;  Surgeon: Aviva Signs Md, MD;  Location: [facility];  Service: Gastroenterology;  Laterality: N/A;   COLONOSCOPY N/A 06/05/2020   Procedure: COLONOSCOPY;  Surgeon: Aviva Signs, MD;  Location: [facility];  Service: Gastroenterology;  Laterality: N/A;    OB History     Gravida  3   Para  2   Term      Preterm      AB  1   Living  2      SAB      IAB  1   Ectopic      Multiple      Live Births  2           Social History   Socioeconomic History   Marital status: Widowed    Spouse name: Not on file   Number of children: 2   Years of education: Not on file   Highest education level: Not on file  Occupational History   Not on file  Tobacco Use   Smoking status: Every Day    Packs/day: 0.50    Years: 30.00    Pack years: 15.00    Types: Cigarettes   Smokeless tobacco: Never  Vaping Use   Vaping Use: Never used  Substance and Sexual Activity   Alcohol use: Yes    Alcohol/week: 12.0 standard drinks    Types: 12 Cans of  beer per week    Comment: daily   Drug use: No   Sexual activity: Yes    Birth control/protection: Post-menopausal  Other Topics Concern   Not on file  Social History Narrative   Not on file   Social Determinants of Health   Financial Resource Strain: Low Risk    Difficulty of Paying Living Expenses: Not hard at all  Food Insecurity: No Food Insecurity   Worried About Charity fundraiser in the Last Year: Never true   [place] in the Last Year: Never true  Transportation Needs: No Transportation Needs   Lack of Transportation (Medical): No   Lack of Transportation (Non-Medical): No  Physical Activity: Insufficiently Active   Days of Exercise per Week: 2 days   Minutes of Exercise per Session: 40 min  Stress: No Stress Concern Present   Feeling of Stress : Not at all  Social Connections: Moderately Isolated   Frequency of Communication with Friends and Family: More than three times a week  Frequency of Social Gatherings with Friends and Family: Twice a week   Attends Religious Services: 1 to 4 times per year   Active Member of [employer] or Organizations: No   Attends Archivist Meetings: Never   Marital Status: Widowed    Family History  Problem Relation Age of Onset   Heart disease Mother    Cancer Father    Stroke Father    Heart disease Maternal Aunt    Heart disease Maternal Uncle    Cancer Paternal Aunt    Cancer Paternal Uncle    Heart disease Paternal Grandfather    Heart disease Paternal Grandmother    Heart disease Maternal Grandmother    Heart disease Maternal Grandfather      Current Outpatient Medications:    acetaminophen (TYLENOL) 500 MG tablet, Take 500 mg by mouth every 6 (six) hours as needed for headache. , Disp: , Rfl:    amLODipine (NORVASC) 10 MG tablet, TAKE 1 TABLET BY MOUTH ONCE DAILY., Disp: 90 tablet, Rfl: 3   calcipotriene (DOVONOX) 0.005 % cream, Apply topically., Disp: , Rfl:    Cholecalciferol (VITAMIN D3 PO), Take 125  mcg by mouth daily., Disp: , Rfl:    rosuvastatin (CRESTOR) 10 MG tablet, Take 5 mg by mouth. Twice a week, Disp: , Rfl:    TURMERIC PO, Take 2,000 mg by mouth. Twice a week, Disp: , Rfl:    valACYclovir (VALTREX) 1000 MG tablet, TAKE 1 TABLET(1000 MG) BY MOUTH DAILY (Patient taking differently: as needed.), Disp: 30 tablet, Rfl: 12  Review of Systems  Review of Systems  Constitutional: Negative for fever, chills, weight loss, malaise/fatigue and diaphoresis.  HENT: Negative for hearing loss, ear pain, nosebleeds, congestion, sore throat, neck pain, tinnitus and ear discharge.   Eyes: Negative for blurred vision, double vision, photophobia, pain, discharge and redness.  Respiratory: Negative for cough, hemoptysis, sputum production, shortness of breath, wheezing and stridor.   Cardiovascular: Negative for chest pain, palpitations, orthopnea, claudication, leg swelling and PND.  Gastrointestinal: negative for abdominal pain. Negative for heartburn, nausea, vomiting, diarrhea, constipation, blood in stool and melena.  Genitourinary: Negative for dysuria, urgency, frequency, hematuria and flank pain.  Musculoskeletal: Negative for myalgias, back pain, joint pain and falls.  Skin: Negative for itching and rash.  Neurological: Negative for dizziness, tingling, tremors, sensory change, speech change, focal weakness, seizures, loss of consciousness, weakness and headaches.  Endo/Heme/Allergies: Negative for environmental allergies and polydipsia. Does not bruise/bleed easily.  Psychiatric/Behavioral: Negative for depression, suicidal ideas, hallucinations, memory loss and substance abuse. The patient is not nervous/anxious and does not have insomnia.        Objective:  Blood pressure 128/82, pulse 65, height 5' 5.5" (1.664 m), weight 143 lb 8 oz (65.1 kg).   Physical Exam  Vitals reviewed. Constitutional: She is oriented to person, place, and time. She appears well-developed and well-nourished.   HENT:  Head: Normocephalic and atraumatic.        Right Ear: External ear normal.  Left Ear: External ear normal.  Nose: Nose normal.  Mouth/Throat: Oropharynx is clear and moist.  Eyes: Conjunctivae and EOM are normal. Pupils are equal, round, and reactive to light. Right eye exhibits no discharge. Left eye exhibits no discharge. No scleral icterus.  Neck: Normal range of motion. Neck supple. No tracheal deviation present. No thyromegaly present.  Cardiovascular: Normal rate, regular rhythm, normal heart sounds and intact distal pulses.  Exam reveals no gallop and no friction rub.   No murmur  heard. Respiratory: Effort normal and breath sounds normal. No respiratory distress. She has no wheezes. She has no rales. She exhibits no tenderness.  GI: Soft. Bowel sounds are normal. She exhibits no distension and no mass. There is no tenderness. There is no rebound and no guarding.  Genitourinary:  Breasts no masses skin changes or nipple changes bilaterally      Vulva is normal without lesions Vagina is pink moist without discharge Cervix normal in appearance and pap is done Uterus is normal size shape and contour Adnexa is negative with normal sized ovaries  {Rectal    hemoccult negative, normal tone, no masses  Musculoskeletal: Normal range of motion. She exhibits no edema and no tenderness.  Neurological: She is alert and oriented to person, place, and time. She has normal reflexes. She displays normal reflexes. No cranial nerve deficit. She exhibits normal muscle tone. Coordination normal.  Skin: Skin is warm and dry. No rash noted. No erythema. No pallor.  Psychiatric: She has a normal mood and affect. Her behavior is normal. Judgment and thought content normal.       Medications Ordered at today's visit: No orders of the defined types were placed in this encounter.   Other orders placed at today's visit: Orders Placed This Encounter  Procedures   POCT occult blood stool       Assessment:    Normal Gyn exam.    Plan:    Contraception: post menopausal status. Follow up in: 3 years.     Return in about 3 years (around 07/23/2024) for yearly.

## 2021-07-26 ENCOUNTER — Ambulatory Visit (HOSPITAL_COMMUNITY)
Admission: RE | Admit: 2021-07-26 | Discharge: 2021-07-26 | Disposition: A | Discharge status: Home / Self Care | Payer: Medicare Other | Source: Ambulatory Visit | Attending: Internal Medicine | Admitting: Internal Medicine

## 2021-07-26 ENCOUNTER — Other Ambulatory Visit: Payer: Self-pay

## 2021-07-26 DIAGNOSIS — Z1231 Encounter for screening mammogram for malignant neoplasm of breast: Secondary | ICD-10-CM | POA: Insufficient documentation

## 2021-07-26 LAB — CYTOLOGY - PAP
Comment: NEGATIVE
Diagnosis: NEGATIVE
High risk HPV: NEGATIVE

## 2021-08-05 ENCOUNTER — Other Ambulatory Visit (HOSPITAL_COMMUNITY): Payer: Self-pay | Admitting: Internal Medicine

## 2021-08-05 DIAGNOSIS — R928 Other abnormal and inconclusive findings on diagnostic imaging of breast: Secondary | ICD-10-CM

## 2021-08-21 ENCOUNTER — Ambulatory Visit (HOSPITAL_COMMUNITY)
Admission: RE | Admit: 2021-08-21 | Discharge: 2021-08-21 | Disposition: A | Discharge status: Home / Self Care | Payer: Medicare Other | Source: Ambulatory Visit | Attending: Internal Medicine | Admitting: Internal Medicine

## 2021-08-21 ENCOUNTER — Other Ambulatory Visit: Payer: Self-pay

## 2021-08-21 DIAGNOSIS — R928 Other abnormal and inconclusive findings on diagnostic imaging of breast: Secondary | ICD-10-CM

## 2021-12-05 ENCOUNTER — Other Ambulatory Visit: Payer: Self-pay | Admitting: Internal Medicine

## 2021-12-05 ENCOUNTER — Other Ambulatory Visit (HOSPITAL_COMMUNITY): Payer: Self-pay | Admitting: Internal Medicine

## 2021-12-23 ENCOUNTER — Other Ambulatory Visit (HOSPITAL_COMMUNITY): Payer: Self-pay | Admitting: Internal Medicine

## 2021-12-23 DIAGNOSIS — I700 Atherosclerosis of aorta: Secondary | ICD-10-CM

## 2021-12-23 DIAGNOSIS — R918 Other nonspecific abnormal finding of lung field: Secondary | ICD-10-CM

## 2021-12-23 DIAGNOSIS — F17200 Nicotine dependence, unspecified, uncomplicated: Secondary | ICD-10-CM

## 2021-12-23 DIAGNOSIS — Z87898 Personal history of other specified conditions: Secondary | ICD-10-CM

## 2022-01-23 DIAGNOSIS — Z20822 Contact with and (suspected) exposure to covid-19: Secondary | ICD-10-CM | POA: Diagnosis not present

## 2022-03-01 DIAGNOSIS — R300 Dysuria: Secondary | ICD-10-CM | POA: Diagnosis not present

## 2022-03-01 DIAGNOSIS — R319 Hematuria, unspecified: Secondary | ICD-10-CM | POA: Diagnosis not present

## 2022-03-01 DIAGNOSIS — N390 Urinary tract infection, site not specified: Secondary | ICD-10-CM | POA: Diagnosis not present

## 2022-03-01 DIAGNOSIS — N3000 Acute cystitis without hematuria: Secondary | ICD-10-CM | POA: Diagnosis not present

## 2022-03-01 DIAGNOSIS — I10 Essential (primary) hypertension: Secondary | ICD-10-CM | POA: Diagnosis not present

## 2022-05-12 IMAGING — MG DIGITAL DIAGNOSTIC BILAT W/ TOMO W/ CAD
8 series · 9 of 24 positions shown · non-contrast
Comparison: Previous exams.

CLINICAL DATA: Screening recall for bilateral breast asymmetries.

EXAM:
DIGITAL DIAGNOSTIC BILATERAL MAMMOGRAM WITH TOMOSYNTHESIS AND CAD
TECHNIQUE: Bilateral digital diagnostic mammography and breast tomosynthesis
was performed. The images were evaluated with computer-aided
detection.

[L CC synth-2D]
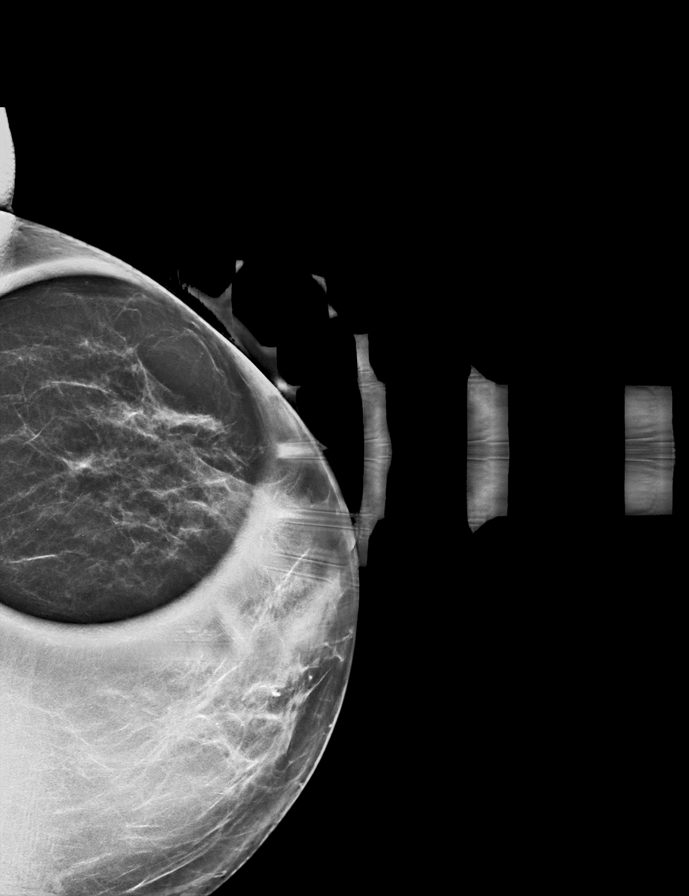

[L MLO synth-2D]
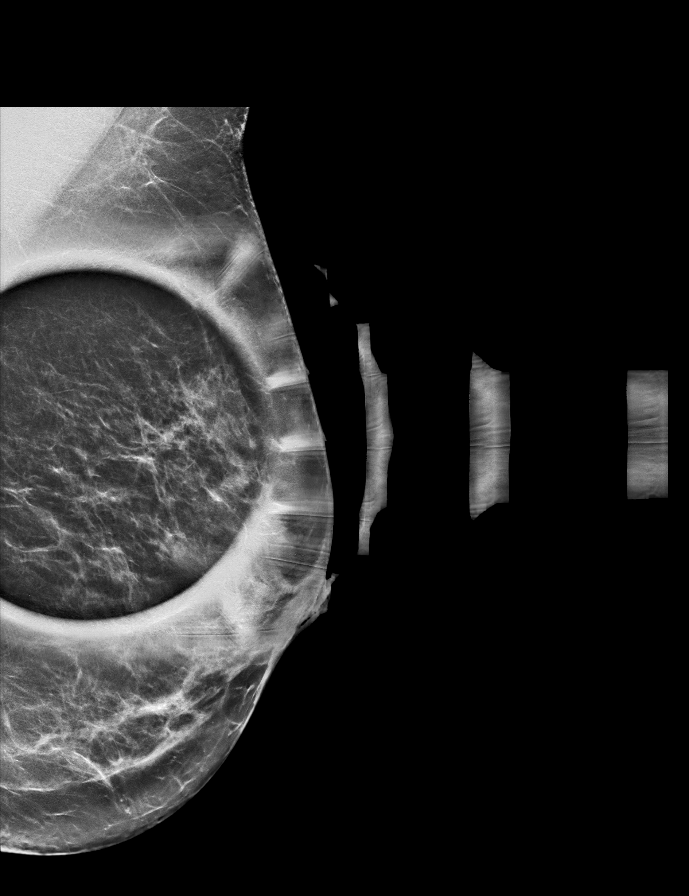

[R CC synth-2D]
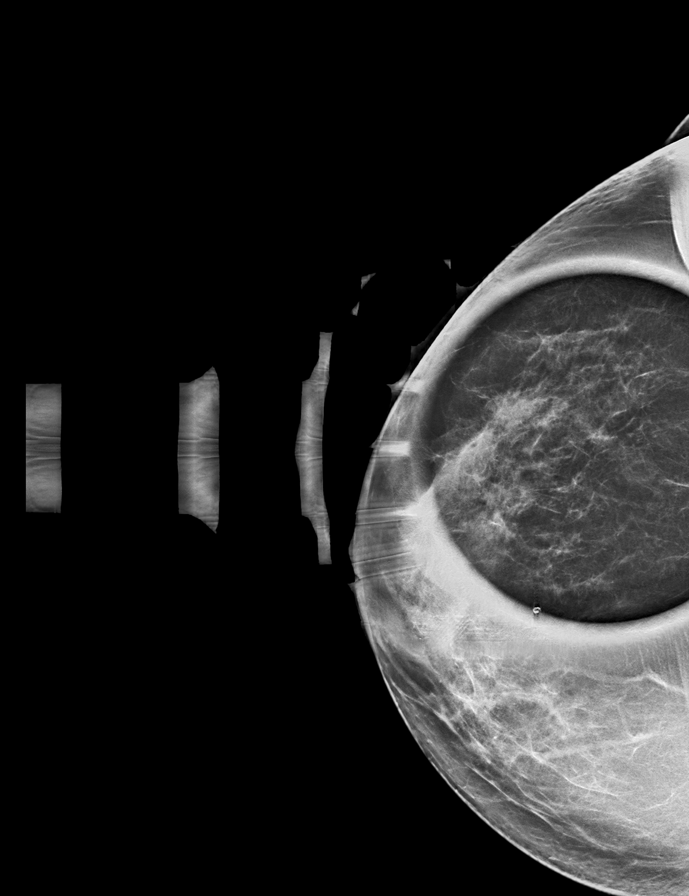

[R MLO synth-2D]
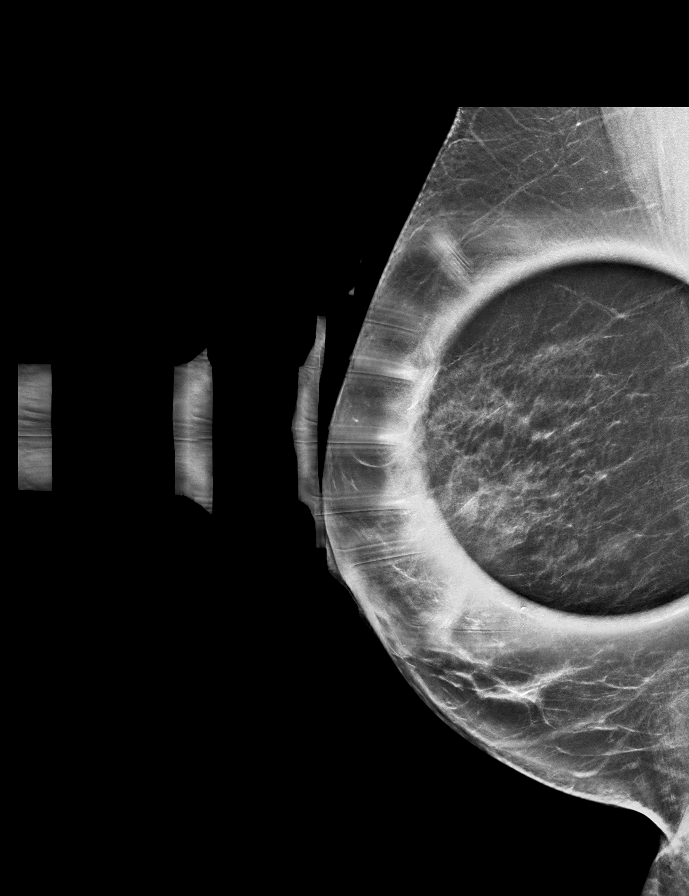

[R CC tomo · 2 of 55 frames shown]
[frame 18/55]
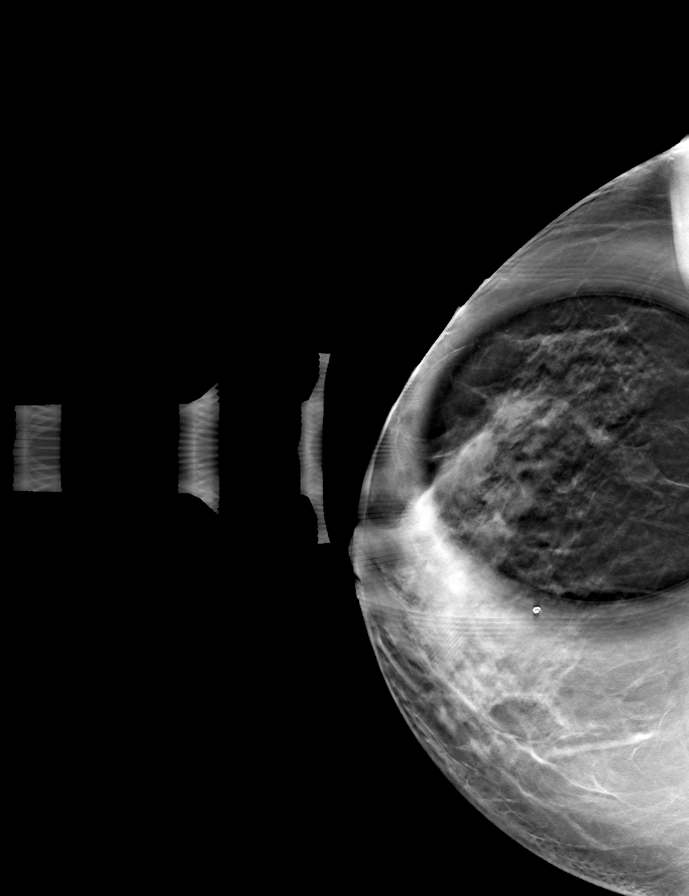
[frame 28/55]
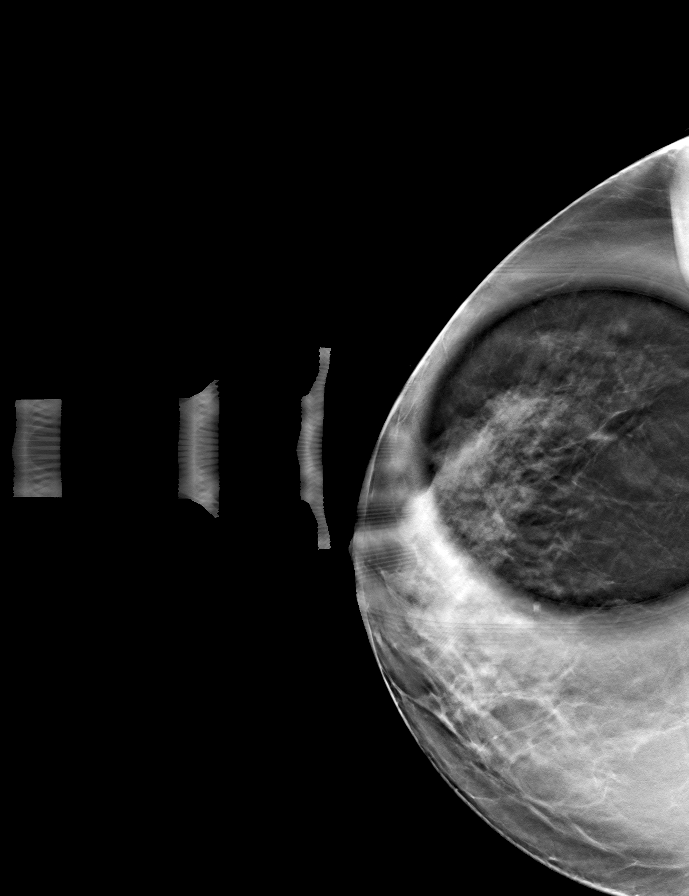

[L CC tomo · tomo slice 27/54.0]
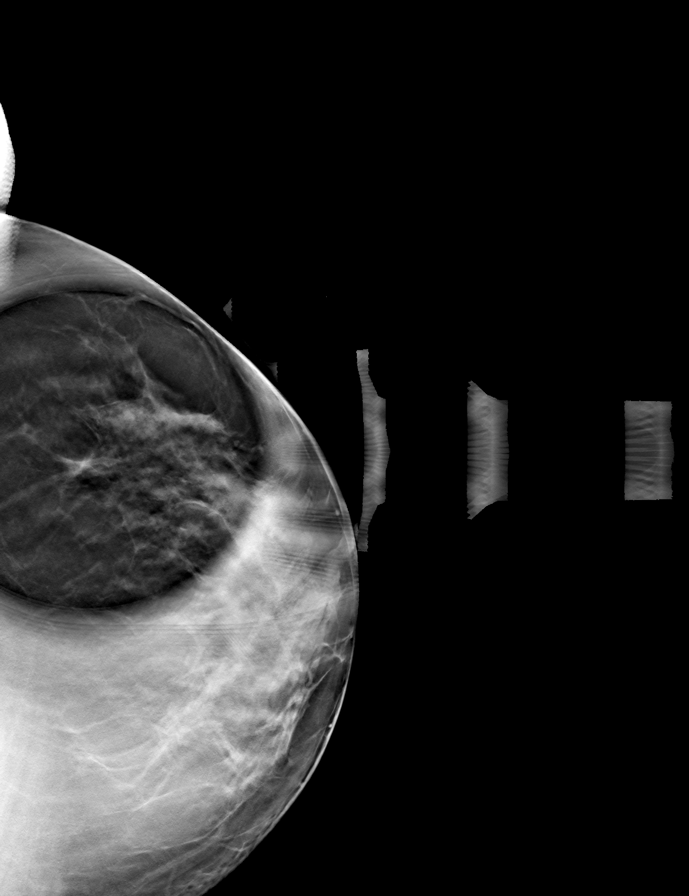

[R MLO tomo · tomo slice 31/62.0]
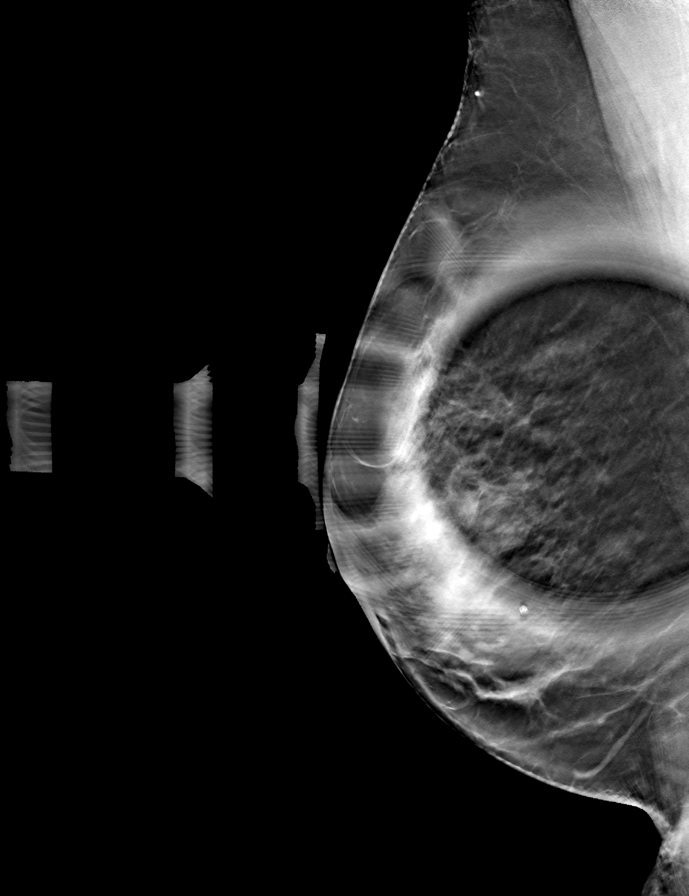

[L MLO tomo · tomo slice 27/54.0]
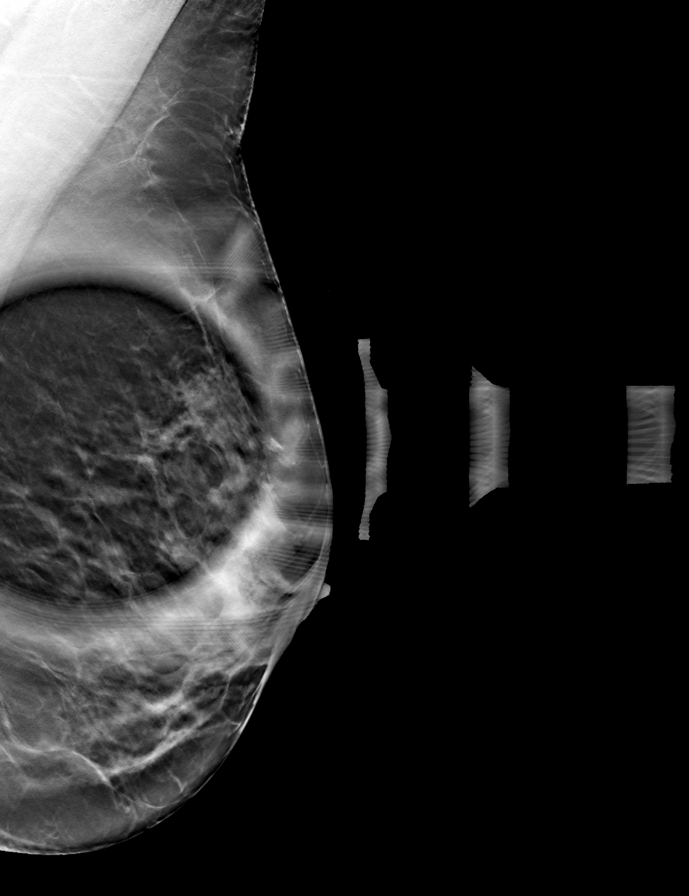

[9 of 24 positions shown; findings below may reference images not displayed]

ACR Breast Density Category c: The breast tissue is heterogeneously
dense, which may obscure small masses.
FINDINGS: Additional tomograms were performed of the bilateral breast. The
initially questioned bilateral breast asymmetries resolve on the
additional imaging. The fibroglandular pattern in the bilateral
breasts is stable in appearance. There is no mammographic evidence
of malignancy in either breast.
IMPRESSION: No mammographic evidence of malignancy in either breast.

RECOMMENDATION:
Screening mammogram in one year.(Code:GI-V-T5K)

I have discussed the findings and recommendations with the patient.
If applicable, a reminder letter will be sent to the patient
regarding the next appointment.

BI-RADS CATEGORY  1: Negative.

## 2022-05-21 ENCOUNTER — Ambulatory Visit (HOSPITAL_COMMUNITY)
Admission: RE | Admit: 2022-05-21 | Discharge: 2022-05-21 | Disposition: A | Discharge status: Home / Self Care | Payer: Medicare HMO | Source: Ambulatory Visit | Attending: Internal Medicine | Admitting: Internal Medicine

## 2022-05-21 DIAGNOSIS — I700 Atherosclerosis of aorta: Secondary | ICD-10-CM | POA: Diagnosis not present

## 2022-05-21 DIAGNOSIS — J439 Emphysema, unspecified: Secondary | ICD-10-CM | POA: Insufficient documentation

## 2022-05-21 DIAGNOSIS — R918 Other nonspecific abnormal finding of lung field: Secondary | ICD-10-CM | POA: Insufficient documentation

## 2022-05-21 DIAGNOSIS — F17210 Nicotine dependence, cigarettes, uncomplicated: Secondary | ICD-10-CM | POA: Insufficient documentation

## 2022-05-21 DIAGNOSIS — Z122 Encounter for screening for malignant neoplasm of respiratory organs: Secondary | ICD-10-CM | POA: Diagnosis not present

## 2022-05-21 DIAGNOSIS — F17200 Nicotine dependence, unspecified, uncomplicated: Secondary | ICD-10-CM | POA: Insufficient documentation

## 2022-07-02 DIAGNOSIS — H5213 Myopia, bilateral: Secondary | ICD-10-CM | POA: Diagnosis not present

## 2022-07-02 DIAGNOSIS — Z0100 Encounter for examination of eyes and vision without abnormal findings: Secondary | ICD-10-CM | POA: Diagnosis not present

## 2022-08-19 DIAGNOSIS — I10 Essential (primary) hypertension: Secondary | ICD-10-CM | POA: Diagnosis not present

## 2022-08-19 DIAGNOSIS — E785 Hyperlipidemia, unspecified: Secondary | ICD-10-CM | POA: Diagnosis not present

## 2022-08-19 DIAGNOSIS — Z79899 Other long term (current) drug therapy: Secondary | ICD-10-CM | POA: Diagnosis not present

## 2022-08-26 DIAGNOSIS — I700 Atherosclerosis of aorta: Secondary | ICD-10-CM | POA: Diagnosis not present

## 2022-08-26 DIAGNOSIS — I10 Essential (primary) hypertension: Secondary | ICD-10-CM | POA: Diagnosis not present

## 2022-08-26 DIAGNOSIS — D751 Secondary polycythemia: Secondary | ICD-10-CM | POA: Diagnosis not present

## 2022-08-26 DIAGNOSIS — E785 Hyperlipidemia, unspecified: Secondary | ICD-10-CM | POA: Diagnosis not present

## 2022-09-20 ENCOUNTER — Other Ambulatory Visit: Payer: Self-pay | Admitting: Adult Health

## 2022-09-26 ENCOUNTER — Other Ambulatory Visit (HOSPITAL_COMMUNITY): Payer: Self-pay | Admitting: Internal Medicine

## 2022-09-26 DIAGNOSIS — Z1231 Encounter for screening mammogram for malignant neoplasm of breast: Secondary | ICD-10-CM

## 2022-10-01 ENCOUNTER — Other Ambulatory Visit: Payer: Self-pay | Admitting: Adult Health

## 2022-10-02 ENCOUNTER — Ambulatory Visit (HOSPITAL_COMMUNITY)
Admission: RE | Admit: 2022-10-02 | Discharge: 2022-10-02 | Disposition: A | Discharge status: Home / Self Care | Payer: Medicare HMO | Source: Ambulatory Visit | Attending: Internal Medicine | Admitting: Internal Medicine

## 2022-10-02 ENCOUNTER — Ambulatory Visit (INDEPENDENT_AMBULATORY_CARE_PROVIDER_SITE_OTHER): Payer: Medicare HMO | Admitting: Adult Health

## 2022-10-02 ENCOUNTER — Encounter: Payer: Self-pay | Admitting: Adult Health

## 2022-10-02 VITALS — BP 109/72 | HR 68 | Ht 64.0 in | Wt 136.5 lb

## 2022-10-02 DIAGNOSIS — Z720 Tobacco use: Secondary | ICD-10-CM

## 2022-10-02 DIAGNOSIS — Z01419 Encounter for gynecological examination (general) (routine) without abnormal findings: Secondary | ICD-10-CM | POA: Diagnosis not present

## 2022-10-02 DIAGNOSIS — Z780 Asymptomatic menopausal state: Secondary | ICD-10-CM

## 2022-10-02 DIAGNOSIS — Z1211 Encounter for screening for malignant neoplasm of colon: Secondary | ICD-10-CM | POA: Diagnosis not present

## 2022-10-02 DIAGNOSIS — I10 Essential (primary) hypertension: Secondary | ICD-10-CM | POA: Diagnosis not present

## 2022-10-02 DIAGNOSIS — Z1231 Encounter for screening mammogram for malignant neoplasm of breast: Secondary | ICD-10-CM | POA: Diagnosis not present

## 2022-10-02 LAB — HEMOCCULT GUIAC POC 1CARD (OFFICE): Fecal Occult Blood, POC: NEGATIVE

## 2022-10-02 NOTE — Progress Notes (Signed)
Patient ID: Julia Marsh, female   DOB: June 01, 1956, 67 y.o.   MRN: [id number] History of Present Illness: Julia Marsh is a 67 year old white female, widowed, pM in for  a well woman gyn exam. She had pap 07/23/21, negative HPV and NILM  PCP is Dr Julia Marsh    Current Medications, Allergies, Past Medical History, Past Surgical History, Family History and Social History were reviewed in [facility] record.     Review of Systems: Patient denies any headaches, hearing loss, fatigue, blurred vision, shortness of breath, chest pain, abdominal pain, problems with bowel movements, urination, or intercourse. No joint pain or mood swings. Legs ache at times    Physical Exam:BP 109/72 (BP Location: Left Arm, Patient Position: Sitting, Cuff Size: Normal)   Pulse 68   Ht '5\' 4"'$  (1.626 m)   Wt 136 lb 8 oz (61.9 kg)   BMI 23.43 kg/m   General:  Well developed, well nourished, no acute distress Skin:  Warm and dry Neck:  Midline trachea, normal thyroid, good ROM, no lymphadenopathy,no carotid bruits heard  Lungs; Clear to auscultation bilaterally after a good cough, had wheeze right upper lobe before cough Breast:  No dominant palpable mass, retraction, or nipple discharge Cardiovascular: Regular rate and rhythm Abdomen:  Soft, non tender, no hepatosplenomegaly Pelvic:  External genitalia is normal in appearance, no lesions.  The vagina is pale. Urethra has no lesions or masses. The cervix is smooth.  Uterus is felt to be normal size, shape, and contour.  No adnexal masses or tenderness noted.Bladder is non tender, no masses felt. Rectal: Good sphincter tone, no polyps, or hemorrhoids felt.  Hemoccult negative. Extremities/musculoskeletal:  No swelling or varicosities noted, no clubbing or cyanosis Psych:  No mood changes, alert and cooperative,seems happy AA is 8, did tell her needs to decrease recommend 1 drink per day for female Fall risk is low    10/02/2022    2:23 PM 07/23/2021     3:52 PM 07/12/2020   10:35 AM  Depression screen PHQ 2/9  Decreased Interest 0 0 1  Down, Depressed, Hopeless 0 0 0  PHQ - 2 Score 0 0 1  Altered sleeping 0 1 2  Tired, decreased energy 0 1 0  Change in appetite 0 0 1  Feeling bad or failure about yourself  0 0 0  Trouble concentrating 0 0 0  Moving slowly or fidgety/restless 0 0 0  Suicidal thoughts 0 0 0  PHQ-9 Score 0 2 4       10/02/2022    2:23 PM 07/23/2021    3:52 PM 07/12/2020   10:35 AM  GAD 7 : Generalized Anxiety Score  Nervous, Anxious, on Edge 0 0 1  Control/stop worrying 0 0 0  Worry too much - different things 0 0 1  Trouble relaxing 0 0 1  Restless 0 0 0  Easily annoyed or irritable 0 0 0  Afraid - awful might happen 0 0 0  Total GAD 7 Score 0 0 3      Upstream - 10/02/22 1420       Pregnancy Intention Screening   Does the patient want to become pregnant in the next year? N/A    Does the patient's partner want to become pregnant in the next year? N/A    Would the patient like to discuss contraceptive options today? N/A      Contraception Wrap Up   Current Method No Method - Other Reason  postmenopausal   Reason for No Current Contraceptive Method at Intake ([facility] Only) Other    End Method No Method - Other Reason   postmenopausal   Contraception Counseling Provided No             Examination chaperoned by Levy Pupa LPN  Impression and Plan: 1. Encounter for well woman exam with routine gynecological exam Pap and physical in 1 year Labs with PCP Had mammogram today Had colonoscopy 2021, she says that was last one  2. Encounter for screening fecal occult blood testing Hemoccult was negative  3. Postmenopause  4. Tobacco use Try to decrease use  Had Chest CT 8/30/23Lung-RADS 2, benign appearance or behavior. Continue annual screening with low-dose chest CT without contrast in 12 months.  5. Hypertension, unspecified type Continue Norvasc 10 mg 1 daily  has refills

## 2022-12-23 DIAGNOSIS — Z7189 Other specified counseling: Secondary | ICD-10-CM | POA: Diagnosis not present

## 2022-12-23 DIAGNOSIS — D2339 Other benign neoplasm of skin of other parts of face: Secondary | ICD-10-CM | POA: Diagnosis not present

## 2022-12-23 DIAGNOSIS — D2322 Other benign neoplasm of skin of left ear and external auricular canal: Secondary | ICD-10-CM | POA: Diagnosis not present

## 2022-12-23 DIAGNOSIS — Z1283 Encounter for screening for malignant neoplasm of skin: Secondary | ICD-10-CM | POA: Diagnosis not present

## 2022-12-23 DIAGNOSIS — D23121 Other benign neoplasm of skin of left upper eyelid, including canthus: Secondary | ICD-10-CM | POA: Diagnosis not present

## 2022-12-23 DIAGNOSIS — D2321 Other benign neoplasm of skin of right ear and external auricular canal: Secondary | ICD-10-CM | POA: Diagnosis not present

## 2022-12-23 DIAGNOSIS — D230 Other benign neoplasm of skin of lip: Secondary | ICD-10-CM | POA: Diagnosis not present

## 2022-12-23 DIAGNOSIS — D23111 Other benign neoplasm of skin of right upper eyelid, including canthus: Secondary | ICD-10-CM | POA: Diagnosis not present

## 2022-12-23 DIAGNOSIS — D234 Other benign neoplasm of skin of scalp and neck: Secondary | ICD-10-CM | POA: Diagnosis not present

## 2023-02-02 DIAGNOSIS — H6122 Impacted cerumen, left ear: Secondary | ICD-10-CM | POA: Diagnosis not present

## 2023-02-02 DIAGNOSIS — H65112 Acute and subacute allergic otitis media (mucoid) (sanguinous) (serous), left ear: Secondary | ICD-10-CM | POA: Diagnosis not present

## 2023-02-25 DIAGNOSIS — D751 Secondary polycythemia: Secondary | ICD-10-CM | POA: Diagnosis not present

## 2023-03-03 DIAGNOSIS — D751 Secondary polycythemia: Secondary | ICD-10-CM | POA: Diagnosis not present

## 2023-03-03 DIAGNOSIS — I10 Essential (primary) hypertension: Secondary | ICD-10-CM | POA: Diagnosis not present

## 2023-03-10 DIAGNOSIS — Z0100 Encounter for examination of eyes and vision without abnormal findings: Secondary | ICD-10-CM | POA: Diagnosis not present

## 2023-03-12 NOTE — Progress Notes (Signed)
[facility] [address], [place] [address]   Clinic Day:  03/13/2023  Referring physician: Carylon Perches, MD  Patient Care Team: Carylon Perches, MD as PCP - General (Internal Medicine)   ASSESSMENT & PLAN:   Assessment:  1.  Erythrocytosis: - Patient seen at the request of Dr. Ouida Sills. - 02/25/2023: Hb-16.4, HCT-46.4, WBC-9.7, PLT-255 - 08/19/2022: Hb-17.2, HCT-49.1 - She has elevated hemoglobin ranging between 16-17 g and hematocrit ranging between 47-49 for the past few years, at least since 2016. - She is current active smoker. - Denies any aquagenic pruritus or vasomotor symptoms.  No history of thrombosis.  2.  Social/family history: - She retired in 2013, worked as a Retail buyer at [employer].  No chemical exposure.  Smokes three fourths of a pack to 1 pack/day for 40 years. - Paternal grandmother died of leukemia.  Father had cancer.  Brother had prostate cancer.  Plan:  1.  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 - We will repeat her CBC today, check serum EPO level.  Will also check JAK2 V617F with reflex testing. - Will send UA for microscopic hematuria. - RTC 4 weeks for follow-up.   Orders Placed This Encounter  Procedures   CBC with Differential    Standing Status:   Future    Number of Occurrences:   1    Standing Expiration Date:   03/12/2024   Erythropoietin    Standing Status:   Future    Number of Occurrences:   1    Standing Expiration Date:   03/12/2024   JAK2 V617F rfx CALR/MPL/E12-15    Standing Status:   Future    Number of Occurrences:   1    Standing Expiration Date:   03/12/2024   Urinalysis, Routine w reflex microscopic    Standing Status:   Future    Number of Occurrences:   1    Standing Expiration Date:   03/12/2024      I,Katie Daubenspeck,acting as a scribe for Doreatha Massed, MD.,have documented all relevant documentation on the behalf of  Doreatha Massed, MD,as directed by  Doreatha Massed, MD while in the presence of Doreatha Massed, MD.   I, Doreatha Massed MD, have reviewed the above documentation for accuracy and completeness, and I agree with the above.   Doreatha Massed, MD   6/21/20249:23 AM  CHIEF COMPLAINT/PURPOSE OF CONSULT:   Diagnosis: secondary polycythemia  Current Therapy: Under workup  HISTORY OF PRESENT ILLNESS:   Julia Marsh is a 67 y.o. female presenting to clinic today for evaluation of secondary polycythemia at the request of Dr. Ouida Sills.  She has a long-standing history of an elevated hemoglobin since at least 2015, with intermittently elevated HCT and MCV since 05/2015. Her most recent CBC from 02/25/23 showed: hgb 16.4 (improved from 17.2 on previous lab work), HCT 46.4; MCH 34. The remainder of her CBC was and has been WNL.  Today, she states that she is doing well overall. Her appetite level is at 100%. Her energy level is at 75%.  PAST MEDICAL HISTORY:   Past Medical History: Past Medical History:  Diagnosis Date   Bartholin cyst    Dyslipidemia 06/10/2016   Will recheck fasting   Enlarged thyroid    Hemorrhoids    Herpes simplex without mention of complication    Hypertension    Incidental lung nodule, > 3mm and < 8mm 09/10/2018   6.9 mm nodule in  lung repeat low dose CT in 6 months    Liver disease    hep C   Mild vitamin D deficiency 06/10/2016   Psoriasis     Surgical History: Past Surgical History:  Procedure Laterality Date   BREAST BIOPSY     COLONOSCOPY N/A 12/19/2014   Procedure: COLONOSCOPY;  Surgeon: Franky Macho Md, MD;  Location: [facility];  Service: Gastroenterology;  Laterality: N/A;   COLONOSCOPY N/A 06/05/2020   Procedure: COLONOSCOPY;  Surgeon: Franky Macho, MD;  Location: [facility];  Service: Gastroenterology;  Laterality: N/A;   WISDOM TOOTH EXTRACTION      Social History: Social History   Socioeconomic History   Marital status:  Widowed    Spouse name: Not on file   Number of children: 2   Years of education: Not on file   Highest education level: Not on file  Occupational History   Not on file  Tobacco Use   Smoking status: Every Day    Packs/day: 0.50    Years: 30.00    Additional pack years: 0.00    Total pack years: 15.00    Types: Cigarettes   Smokeless tobacco: Never  Vaping Use   Vaping Use: Never used  Substance and Sexual Activity   Alcohol use: Yes    Alcohol/week: 12.0 standard drinks of alcohol    Types: 12 Cans of beer per week    Comment: daily   Drug use: No   Sexual activity: Yes    Birth control/protection: Post-menopausal  Other Topics Concern   Not on file  Social History Narrative   Not on file   Social Determinants of Health   Financial Resource Strain: Low Risk  (10/02/2022)   Overall Financial Resource Strain (CARDIA)    Difficulty of Paying Living Expenses: Not hard at all  Food Insecurity: No Food Insecurity (10/02/2022)   Hunger Vital Sign    Worried About Running Out of Food in the Last Year: Never true    Ran Out of Food in the Last Year: Never true  Transportation Needs: No Transportation Needs (10/02/2022)   PRAPARE - Administrator, Civil Service (Medical): No    Lack of Transportation (Non-Medical): No  Physical Activity: Sufficiently Active (10/02/2022)   Exercise Vital Sign    Days of Exercise per Week: 3 days    Minutes of Exercise per Session: 70 min  Stress: No Stress Concern Present (10/02/2022)   [employer] of Occupational Health - Occupational Stress Questionnaire    Feeling of Stress : Not at all  Social Connections: Moderately Isolated (10/02/2022)   Social Connection and Isolation Panel [NHANES]    Frequency of Communication with Friends and Family: More than three times a week    Frequency of Social Gatherings with Friends and Family: Three times a week    Attends Religious Services: 1 to 4 times per year    Active Member of Clubs  or Organizations: No    Attends Banker Meetings: Never    Marital Status: Widowed  Intimate Partner Violence: Not At Risk (10/02/2022)   Humiliation, Afraid, Rape, and Kick questionnaire    Fear of Current or Ex-Partner: No    Emotionally Abused: No    Physically Abused: No    Sexually Abused: No    Family History: Family History  Problem Relation Age of Onset   Heart disease Mother    Cancer Father    Stroke Father    Heart disease  Maternal Aunt    Heart disease Maternal Uncle    Cancer Paternal Aunt    Cancer Paternal Uncle    Heart disease Paternal Grandfather    Heart disease Paternal Grandmother    Heart disease Maternal Grandmother    Heart disease Maternal Grandfather     Current Medications:  Current Outpatient Medications:    acetaminophen (TYLENOL) 500 MG tablet, Take 500 mg by mouth every 6 (six) hours as needed for headache. , Disp: , Rfl:    amLODipine (NORVASC) 10 MG tablet, TAKE 1 TABLET BY MOUTH ONCE DAILY., Disp: 90 tablet, Rfl: 4   aspirin EC 81 MG tablet, Take 81 mg by mouth daily. Swallow whole., Disp: , Rfl:    Cholecalciferol (VITAMIN D3 PO), Take 125 mcg by mouth daily., Disp: , Rfl:    nystatin-triamcinolone ointment (MYCOLOG), Apply 1 application topically 2 (two) times daily., Disp: 30 g, Rfl: 11   rosuvastatin (CRESTOR) 10 MG tablet, Take 5 mg by mouth. Twice a week, Disp: , Rfl:    valACYclovir (VALTREX) 1000 MG tablet, TAKE 1 TABLET(1000 MG) BY MOUTH DAILY, Disp: 30 tablet, Rfl: 12   Allergies: Allergies  Allergen Reactions   Penicillins Rash    40 years ago    REVIEW OF SYSTEMS:   Review of Systems  Constitutional:  Negative for chills, fatigue and fever.  HENT:   Negative for lump/mass, mouth sores, nosebleeds, sore throat and trouble swallowing.   Eyes:  Negative for eye problems.  Respiratory:  Negative for cough and shortness of breath.   Cardiovascular:  Negative for chest pain, leg swelling and palpitations.   Gastrointestinal:  Negative for abdominal pain, constipation, diarrhea, nausea and vomiting.  Genitourinary:  Negative for bladder incontinence, difficulty urinating, dysuria, frequency, hematuria and nocturia.   Musculoskeletal:  Negative for arthralgias, back pain, flank pain, myalgias and neck pain.  Skin:  Negative for itching and rash.  Neurological:  Positive for headaches. Negative for dizziness and numbness.  Hematological:  Bruises/bleeds easily.  Psychiatric/Behavioral:  Negative for depression, sleep disturbance and suicidal ideas. The patient is not nervous/anxious.   All other systems reviewed and are negative.    VITALS:   Blood pressure 117/79, pulse 70, temperature 98.1 F (36.7 C), temperature source Oral, resp. rate 20, height 5\' 4"  (1.626 m), weight 130 lb (59 kg), SpO2 100 %.  Wt Readings from Last 3 Encounters:  03/13/23 130 lb (59 kg)  10/02/22 136 lb 8 oz (61.9 kg)  07/23/21 143 lb 8 oz (65.1 kg)    Body mass index is 22.31 kg/m.   PHYSICAL EXAM:   Physical Exam Vitals and nursing note reviewed. Exam conducted with a chaperone present.  Constitutional:      Appearance: Normal appearance.  Cardiovascular:     Rate and Rhythm: Normal rate and regular rhythm.     Pulses: Normal pulses.     Heart sounds: Normal heart sounds.  Pulmonary:     Effort: Pulmonary effort is normal.     Breath sounds: Normal breath sounds.  Abdominal:     Palpations: Abdomen is soft. There is no hepatomegaly, splenomegaly or mass.     Tenderness: There is no abdominal tenderness.  Musculoskeletal:     Right lower leg: No edema.     Left lower leg: No edema.  Lymphadenopathy:     Cervical: No cervical adenopathy.     Right cervical: No superficial, deep or posterior cervical adenopathy.    Left cervical: No superficial, deep or posterior  cervical adenopathy.     Upper Body:     Right upper body: No supraclavicular or axillary adenopathy.     Left upper body: No  supraclavicular or axillary adenopathy.  Neurological:     General: No focal deficit present.     Mental Status: She is alert and oriented to person, place, and time.  Psychiatric:        Mood and Affect: Mood normal.        Behavior: Behavior normal.     LABS:      Latest Ref Rng & Units 06/24/2018   10:35 AM 06/12/2017   10:27 AM 06/09/2016   11:57 AM  CBC  WBC 3.4 - 10.8 x10E3/uL 7.3  6.6  7.4   Hemoglobin 11.1 - 15.9 g/dL 45.4  09.8  11.9   Hematocrit 34.0 - 46.6 % 47.7  49.1  47.5   Platelets 150 - 450 x10E3/uL 229  219  223       Latest Ref Rng & Units 06/24/2018   10:35 AM 06/12/2017   10:27 AM 07/02/2016   10:59 AM  CMP  Glucose 65 - 99 mg/dL 91  96  88   BUN 8 - 27 mg/dL 9  12  8    Creatinine 0.57 - 1.00 mg/dL 1.47  8.29  5.62   Sodium 134 - 144 mmol/L 140  141  143   Potassium 3.5 - 5.2 mmol/L 4.3  4.7  4.6   Chloride 96 - 106 mmol/L 101  99  102   CO2 20 - 29 mmol/L 25  24  25    Calcium 8.7 - 10.3 mg/dL 13.0  86.5  9.2   Total Protein 6.0 - 8.5 g/dL 7.5  7.6  7.3   Total Bilirubin 0.0 - 1.2 mg/dL 0.6  0.8  0.3   Alkaline Phos 39 - 117 IU/L 94  98  95   AST 0 - 40 IU/L 58  20  31   ALT 0 - 32 IU/L 62  20  33      No results found for: "CEA1", "CEA" / No results found for: "CEA1", "CEA" No results found for: "PSA1" No results found for: "HQI696" No results found for: "CAN125"  No results found for: "TOTALPROTELP", "ALBUMINELP", "A1GS", "A2GS", "BETS", "BETA2SER", "GAMS", "MSPIKE", "SPEI" No results found for: "TIBC", "FERRITIN", "IRONPCTSAT" No results found for: "LDH"   STUDIES:   No results found.

## 2023-03-13 ENCOUNTER — Encounter: Payer: Self-pay | Admitting: Hematology

## 2023-03-13 ENCOUNTER — Inpatient Hospital Stay: Payer: Medicare HMO

## 2023-03-13 ENCOUNTER — Inpatient Hospital Stay: Payer: Medicare HMO | Attending: Hematology | Admitting: Hematology

## 2023-03-13 VITALS — BP 117/79 | HR 70 | Temp 98.1°F | Resp 20 | Ht 64.0 in | Wt 130.0 lb

## 2023-03-13 DIAGNOSIS — F17210 Nicotine dependence, cigarettes, uncomplicated: Secondary | ICD-10-CM | POA: Insufficient documentation

## 2023-03-13 DIAGNOSIS — Z8042 Family history of malignant neoplasm of prostate: Secondary | ICD-10-CM | POA: Insufficient documentation

## 2023-03-13 DIAGNOSIS — Z806 Family history of leukemia: Secondary | ICD-10-CM | POA: Diagnosis not present

## 2023-03-13 DIAGNOSIS — D582 Other hemoglobinopathies: Secondary | ICD-10-CM | POA: Diagnosis not present

## 2023-03-13 DIAGNOSIS — R3129 Other microscopic hematuria: Secondary | ICD-10-CM | POA: Insufficient documentation

## 2023-03-13 DIAGNOSIS — D751 Secondary polycythemia: Secondary | ICD-10-CM | POA: Insufficient documentation

## 2023-03-13 LAB — URINALYSIS, ROUTINE W REFLEX MICROSCOPIC
Bilirubin Urine: NEGATIVE
Glucose, UA: NEGATIVE mg/dL
Hgb urine dipstick: NEGATIVE
Ketones, ur: NEGATIVE mg/dL
Leukocytes,Ua: NEGATIVE
Nitrite: NEGATIVE
Protein, ur: NEGATIVE mg/dL
Specific Gravity, Urine: 1.008 (ref 1.005–1.030)
pH: 6 (ref 5.0–8.0)

## 2023-03-13 LAB — CBC WITH DIFFERENTIAL/PLATELET
Abs Immature Granulocytes: 0.04 10*3/uL (ref 0.00–0.07)
Basophils Absolute: 0 10*3/uL (ref 0.0–0.1)
Basophils Relative: 0 %
Eosinophils Absolute: 0.1 10*3/uL (ref 0.0–0.5)
Eosinophils Relative: 1 %
HCT: 47.5 % — ABNORMAL HIGH (ref 36.0–46.0)
Hemoglobin: 16.3 g/dL — ABNORMAL HIGH (ref 12.0–15.0)
Immature Granulocytes: 1 %
Lymphocytes Relative: 23 %
Lymphs Abs: 2 10*3/uL (ref 0.7–4.0)
MCH: 33.9 pg (ref 26.0–34.0)
MCHC: 34.3 g/dL (ref 30.0–36.0)
MCV: 98.8 fL (ref 80.0–100.0)
Monocytes Absolute: 0.8 10*3/uL (ref 0.1–1.0)
Monocytes Relative: 9 %
Neutro Abs: 5.7 10*3/uL (ref 1.7–7.7)
Neutrophils Relative %: 66 %
Platelets: 247 10*3/uL (ref 150–400)
RBC: 4.81 MIL/uL (ref 3.87–5.11)
RDW: 12.6 % (ref 11.5–15.5)
WBC: 8.6 10*3/uL (ref 4.0–10.5)
nRBC: 0 % (ref 0.0–0.2)

## 2023-03-13 NOTE — Patient Instructions (Signed)
You were seen and examined today by Dr. Katragadda. Dr. Katragadda is a hematologist, meaning that he specializes in blood abnormalities. Dr. Katragadda discussed your past medical history, family history of cancers/blood conditions and the events that led to you being here today.  You were referred to Dr. Katragadda due to elevated hemoglobin.  Dr. Katragadda has recommended additional labs today for further evaluation.  Follow-up as scheduled.  

## 2023-03-14 LAB — ERYTHROPOIETIN: Erythropoietin: 7.9 m[IU]/mL (ref 2.6–18.5)

## 2023-03-24 DIAGNOSIS — H6121 Impacted cerumen, right ear: Secondary | ICD-10-CM | POA: Diagnosis not present

## 2023-03-25 LAB — JAK2 V617F RFX CALR/MPL/E12-15

## 2023-03-25 LAB — CALR +MPL + E12-E15  (REFLEX)

## 2023-04-07 NOTE — Progress Notes (Unsigned)
[facility] [address] [place] [address]   Clinic Day:  04/08/2023  Referring physician: Carylon Perches, MD  Patient Care Team: Carylon Perches, MD as PCP - General (Internal Medicine)   ASSESSMENT & PLAN:   Assessment:  1.  Erythrocytosis: - Patient seen at the request of Dr. Ouida Sills. - 02/25/2023: Hb-16.4, HCT-46.4, WBC-9.7, PLT-255 - 08/19/2022: Hb-17.2, HCT-49.1 - She has elevated hemoglobin ranging between 16-17 g and hematocrit ranging between 47-49 for the past few years, at least since 2016. - She is current active smoker. - Denies any aquagenic pruritus or vasomotor symptoms.  No history of thrombosis.  2.  Social/family history: - She retired in 2013, worked as a Retail buyer at [employer].  No chemical exposure.  Smokes three fourths of a pack to 1 pack/day for 40 years. - Paternal grandmother died of leukemia.  Father had cancer.  Brother had prostate cancer.  Plan:  1.  Erythrocytosis: - Labs from 03/13/2023 showed hemoglobin of 16.3 with a normal differential.  JAK2 with reflex was negative.  Erythropoietin levels are within normal range. -Urinalysis not reveal any hematuria. - Erythrocytosis is likely secondary to cigarette smoking. -We discussed smoking cessation in detail.  She is currently taking Chantix although not consistently.  She is interested in starting a nicotine patch.  21 mg nicotine patch sent to her pharmacy for her to start daily.  Discussed the role of the nicotine patch along with Chantix.  Apply each morning after removing the previous day nicotine patch.  -Return to clinic in 4 to 5 months for follow-up with labs a few days before and open visit.   PLAN SUMMARY: >> Return to clinic in 4 to 5 months with labs a few days before and telephone visit. >> Continue Chantix and may start nicotine patch.     No orders of the defined types were placed in this encounter.   Mauro Kaufmann, NP   7/17/202410:14 AM  CHIEF  COMPLAINT/PURPOSE OF CONSULT:   Diagnosis: secondary polycythemia  Current Therapy: Under workup  HISTORY OF PRESENT ILLNESS:   Julia Marsh is a 67 y.o. female presenting to clinic today for follow-up and to review labs for secondary polycythemia.  Reports doing well since her last visit.  She was started on Chantix about a week ago and has noticed a slight decrease in the amount of cigarettes she is smoking when she takes her medication.  Reports it causes some nausea.  She is smoking anywhere from 12 to 15 cigarettes daily.  She has occasional shortness of breath and trouble sleeping but this is chronic.  Denies any pain.  Reports an occasional headache.  Today, she states that she is doing well overall. Her appetite level is at 100%. Her energy level is at 75%.  PAST MEDICAL HISTORY:   Past Medical History: Past Medical History:  Diagnosis Date   Bartholin cyst    Dyslipidemia 06/10/2016   Will recheck fasting   Enlarged thyroid    Hemorrhoids    Herpes simplex without mention of complication    Hypertension    Incidental lung nodule, > 3mm and < 8mm 09/10/2018   6.9 mm nodule in lung repeat low dose CT in 6 months    Liver disease    hep C   Mild vitamin D deficiency 06/10/2016   Psoriasis     Surgical History: Past Surgical History:  Procedure Laterality Date   BREAST BIOPSY     COLONOSCOPY N/A 12/19/2014  Procedure: COLONOSCOPY;  Surgeon: Franky Macho Md, MD;  Location: [facility];  Service: Gastroenterology;  Laterality: N/A;   COLONOSCOPY N/A 06/05/2020   Procedure: COLONOSCOPY;  Surgeon: Franky Macho, MD;  Location: [facility];  Service: Gastroenterology;  Laterality: N/A;   WISDOM TOOTH EXTRACTION      Social History: Social History   Socioeconomic History   Marital status: Widowed    Spouse name: Not on file   Number of children: 2   Years of education: Not on file   Highest education level: Not on file  Occupational History   Not on file  Tobacco  Use   Smoking status: Every Day    Current packs/day: 0.50    Average packs/day: 0.5 packs/day for 30.0 years (15.0 ttl pk-yrs)    Types: Cigarettes   Smokeless tobacco: Never  Vaping Use   Vaping status: Never Used  Substance and Sexual Activity   Alcohol use: Yes    Alcohol/week: 12.0 standard drinks of alcohol    Types: 12 Cans of beer per week    Comment: daily   Drug use: No   Sexual activity: Yes    Birth control/protection: Post-menopausal  Other Topics Concern   Not on file  Social History Narrative   Not on file   Social Determinants of Health   Financial Resource Strain: Low Risk  (10/02/2022)   Overall Financial Resource Strain (CARDIA)    Difficulty of Paying Living Expenses: Not hard at all  Food Insecurity: No Food Insecurity (10/02/2022)   Hunger Vital Sign    Worried About Running Out of Food in the Last Year: Never true    Ran Out of Food in the Last Year: Never true  Transportation Needs: No Transportation Needs (10/02/2022)   PRAPARE - Administrator, Civil Service (Medical): No    Lack of Transportation (Non-Medical): No  Physical Activity: Sufficiently Active (10/02/2022)   Exercise Vital Sign    Days of Exercise per Week: 3 days    Minutes of Exercise per Session: 70 min  Stress: No Stress Concern Present (10/02/2022)   [employer] of Occupational Health - Occupational Stress Questionnaire    Feeling of Stress : Not at all  Social Connections: Moderately Isolated (10/02/2022)   Social Connection and Isolation Panel [NHANES]    Frequency of Communication with Friends and Family: More than three times a week    Frequency of Social Gatherings with Friends and Family: Three times a week    Attends Religious Services: 1 to 4 times per year    Active Member of Clubs or Organizations: No    Attends Banker Meetings: Never    Marital Status: Widowed  Intimate Partner Violence: Not At Risk (10/02/2022)   Humiliation, Afraid, Rape,  and Kick questionnaire    Fear of Current or Ex-Partner: No    Emotionally Abused: No    Physically Abused: No    Sexually Abused: No    Family History: Family History  Problem Relation Age of Onset   Heart disease Mother    Cancer Father    Stroke Father    Heart disease Maternal Aunt    Heart disease Maternal Uncle    Cancer Paternal Aunt    Cancer Paternal Uncle    Heart disease Paternal Grandfather    Heart disease Paternal Grandmother    Heart disease Maternal Grandmother    Heart disease Maternal Grandfather     Current Medications:  Current Outpatient Medications:  acetaminophen (TYLENOL) 500 MG tablet, Take 500 mg by mouth every 6 (six) hours as needed for headache. , Disp: , Rfl:    amLODipine (NORVASC) 10 MG tablet, TAKE 1 TABLET BY MOUTH ONCE DAILY., Disp: 90 tablet, Rfl: 4   aspirin EC 81 MG tablet, Take 81 mg by mouth daily. Swallow whole., Disp: , Rfl:    Cholecalciferol (VITAMIN D3 PO), Take 125 mcg by mouth daily., Disp: , Rfl:    nicotine (NICODERM CQ - DOSED IN MG/24 HOURS) 21 mg/24hr patch, Place 1 patch (21 mg total) onto the skin daily., Disp: 28 patch, Rfl: 0   nystatin-triamcinolone ointment (MYCOLOG), Apply 1 application topically 2 (two) times daily., Disp: 30 g, Rfl: 11   rosuvastatin (CRESTOR) 10 MG tablet, Take 5 mg by mouth. Twice a week, Disp: , Rfl:    valACYclovir (VALTREX) 1000 MG tablet, TAKE 1 TABLET(1000 MG) BY MOUTH DAILY, Disp: 30 tablet, Rfl: 12   Varenicline Tartrate, Starter, 0.5 MG X 11 & 1 MG X 42 TBPK, See admin instructions., Disp: , Rfl:    Allergies: Allergies  Allergen Reactions   Penicillins Rash    40 years ago    REVIEW OF SYSTEMS:   Review of Systems  Respiratory:  Positive for shortness of breath.   Gastrointestinal:  Positive for nausea.  Neurological:  Positive for headaches.     VITALS:   Blood pressure (!) 143/87, pulse 86, temperature (!) 97.4 F (36.3 C), temperature source Tympanic, resp. rate 18,  weight 126 lb 6.4 oz (57.3 kg), SpO2 98%.  Wt Readings from Last 3 Encounters:  04/08/23 126 lb 6.4 oz (57.3 kg)  03/13/23 130 lb (59 kg)  10/02/22 136 lb 8 oz (61.9 kg)    Body mass index is 21.7 kg/m.   PHYSICAL EXAM:   Physical Exam Constitutional:      Appearance: Normal appearance.  Cardiovascular:     Rate and Rhythm: Normal rate and regular rhythm.  Pulmonary:     Effort: Pulmonary effort is normal.     Breath sounds: Normal breath sounds.  Abdominal:     General: Bowel sounds are normal.     Palpations: Abdomen is soft.  Musculoskeletal:        General: Normal range of motion.  Neurological:     Mental Status: She is alert and oriented to person, place, and time. Mental status is at baseline.     LABS:      Latest Ref Rng & Units 03/13/2023    9:15 AM 06/24/2018   10:35 AM 06/12/2017   10:27 AM  CBC  WBC 4.0 - 10.5 K/uL 8.6  7.3  6.6   Hemoglobin 12.0 - 15.0 g/dL 46.9  62.9  52.8   Hematocrit 36.0 - 46.0 % 47.5  47.7  49.1   Platelets 150 - 400 K/uL 247  229  219       Latest Ref Rng & Units 06/24/2018   10:35 AM 06/12/2017   10:27 AM 07/02/2016   10:59 AM  CMP  Glucose 65 - 99 mg/dL 91  96  88   BUN 8 - 27 mg/dL 9  12  8    Creatinine 0.57 - 1.00 mg/dL 4.13  2.44  0.10   Sodium 134 - 144 mmol/L 140  141  143   Potassium 3.5 - 5.2 mmol/L 4.3  4.7  4.6   Chloride 96 - 106 mmol/L 101  99  102   CO2 20 - 29 mmol/L 25  24  25   Calcium 8.7 - 10.3 mg/dL 16.1  09.6  9.2   Total Protein 6.0 - 8.5 g/dL 7.5  7.6  7.3   Total Bilirubin 0.0 - 1.2 mg/dL 0.6  0.8  0.3   Alkaline Phos 39 - 117 IU/L 94  98  95   AST 0 - 40 IU/L 58  20  31   ALT 0 - 32 IU/L 62  20  33      No results found for: "CEA1", "CEA" / No results found for: "CEA1", "CEA" No results found for: "PSA1" No results found for: "EAV409" No results found for: "CAN125"  No results found for: "TOTALPROTELP", "ALBUMINELP", "A1GS", "A2GS", "BETS", "BETA2SER", "GAMS", "MSPIKE", "SPEI" No results  found for: "TIBC", "FERRITIN", "IRONPCTSAT" No results found for: "LDH"   STUDIES:   No results found.

## 2023-04-08 ENCOUNTER — Inpatient Hospital Stay: Payer: Medicare HMO | Attending: Oncology | Admitting: Oncology

## 2023-04-08 VITALS — BP 143/87 | HR 86 | Temp 97.4°F | Resp 18 | Wt 126.4 lb

## 2023-04-08 DIAGNOSIS — D751 Secondary polycythemia: Secondary | ICD-10-CM | POA: Diagnosis not present

## 2023-04-08 DIAGNOSIS — D582 Other hemoglobinopathies: Secondary | ICD-10-CM

## 2023-04-08 DIAGNOSIS — Z806 Family history of leukemia: Secondary | ICD-10-CM | POA: Diagnosis not present

## 2023-04-08 DIAGNOSIS — F17210 Nicotine dependence, cigarettes, uncomplicated: Secondary | ICD-10-CM | POA: Diagnosis not present

## 2023-04-08 DIAGNOSIS — Z8042 Family history of malignant neoplasm of prostate: Secondary | ICD-10-CM | POA: Insufficient documentation

## 2023-04-08 MED ORDER — NICOTINE 21 MG/24HR TD PT24: 21.0000 mg | MEDICATED_PATCH | Freq: Every day | TRANSDERMAL | 0 refills | Status: AC

## 2023-05-21 ENCOUNTER — Other Ambulatory Visit (HOSPITAL_COMMUNITY): Payer: Self-pay | Admitting: Internal Medicine

## 2023-05-21 DIAGNOSIS — Z87898 Personal history of other specified conditions: Secondary | ICD-10-CM

## 2023-06-23 ENCOUNTER — Other Ambulatory Visit: Payer: Self-pay | Admitting: *Deleted

## 2023-06-23 DIAGNOSIS — D582 Other hemoglobinopathies: Secondary | ICD-10-CM

## 2023-06-28 ENCOUNTER — Encounter: Payer: Self-pay | Admitting: Emergency Medicine

## 2023-06-28 ENCOUNTER — Ambulatory Visit
Admission: EM | Admit: 2023-06-28 | Discharge: 2023-06-28 | Disposition: A | Discharge status: Home / Self Care | Payer: Medicare HMO | Attending: Family Medicine | Admitting: Family Medicine

## 2023-06-28 ENCOUNTER — Other Ambulatory Visit: Payer: Self-pay

## 2023-06-28 ENCOUNTER — Ambulatory Visit: Payer: Medicare HMO

## 2023-06-28 DIAGNOSIS — R0781 Pleurodynia: Secondary | ICD-10-CM | POA: Diagnosis not present

## 2023-06-28 DIAGNOSIS — S2242XA Multiple fractures of ribs, left side, initial encounter for closed fracture: Secondary | ICD-10-CM | POA: Diagnosis not present

## 2023-06-28 DIAGNOSIS — M8588 Other specified disorders of bone density and structure, other site: Secondary | ICD-10-CM | POA: Diagnosis not present

## 2023-06-28 MED ORDER — LIDOCAINE 5 % EX PTCH: 1.0000 | MEDICATED_PATCH | CUTANEOUS | 0 refills | Status: DC

## 2023-06-28 MED ORDER — TRAMADOL HCL 50 MG PO TABS: 50.0000 mg | ORAL_TABLET | Freq: Two times a day (BID) | ORAL | 0 refills | Status: DC | PRN

## 2023-06-28 MED ORDER — TIZANIDINE HCL 2 MG PO CAPS: 2.0000 mg | ORAL_CAPSULE | Freq: Three times a day (TID) | ORAL | 0 refills | Status: DC | PRN

## 2023-06-28 NOTE — ED Triage Notes (Addendum)
Pt reports left sided rib pain since tripping and falling while trying to enter vehicle on Thursday after a concert. Pt reports landed on railroad track. Pt denies hitting head, loc, or being on blood thinners.Pt reports increased pain and shortness of breath since last night.   Mild dyspnea with exertion noted in triage. NAD noted. Chest expansion symmetrical. Airway patent.

## 2023-06-28 NOTE — Discharge Instructions (Signed)
I have sent over some medications to help with your pain.  If anything comes back new abnormal on your x-ray I will give you a call.  Go to the emergency department if your symptoms ever severely worsen

## 2023-07-01 NOTE — ED Provider Notes (Signed)
[facility]    CSN: [id number] Arrival date & time: 06/28/23  1418      History   Chief Complaint Chief Complaint  Patient presents with   Fall    HPI Julia Marsh is a 67 y.o. female.   Patient presenting today with severe left-sided rib pain that started when she tripped and fell onto some railroad tracks 3 days ago.  She states the pain significantly worsened after rolling over in bed overnight and now having severe pain with breathing causing some feelings of shortness of breath.  Denies bruising or swelling to the area, palpitations, dizziness, head injury or loss of consciousness, wheezing.  She is a cigarette smoker, no known diagnosed chronic pulmonary disease.  So far trying over-the-counter pain relievers with minimal relief.    Past Medical History:  Diagnosis Date   Bartholin cyst    Dyslipidemia 06/10/2016   Will recheck fasting   Enlarged thyroid    Hemorrhoids    Herpes simplex without mention of complication    Hypertension    Incidental lung nodule, > 3mm and < 8mm 09/10/2018   6.9 mm nodule in lung repeat low dose CT in 6 months    Liver disease    hep C   Mild vitamin D deficiency 06/10/2016   Psoriasis     Patient Active Problem List   Diagnosis Date Noted   Encounter for screening fecal occult blood testing 10/02/2022   Postmenopause 10/02/2022   Special screening for malignant neoplasms, colon    Encounter for colorectal cancer screening 06/29/2019   Encounter for well woman exam with routine gynecological exam 06/29/2019   Incidental lung nodule, > 3mm and < 8mm 09/10/2018   Encounter for gynecological examination with Papanicolaou smear of cervix 06/24/2018   Screening examination for STD (sexually transmitted disease) 06/24/2018   Smoker 06/24/2018   History of hepatitis C 06/24/2018   Essential hypertension 06/24/2018   Tobacco use 06/24/2018   Screening for diabetes mellitus 06/24/2018   Encounter for well woman exam  06/10/2017   Screening for colorectal cancer 06/10/2017   Mild vitamin D deficiency 06/10/2016   Dyslipidemia 06/10/2016   Hepatitis C 05/24/2013   Hypertension 05/24/2013   History of herpes genitalis 05/24/2013   Thyroid nodule 05/24/2013    Past Surgical History:  Procedure Laterality Date   BREAST BIOPSY     COLONOSCOPY N/A 12/19/2014   Procedure: COLONOSCOPY;  Surgeon: Franky Macho Md, MD;  Location: [facility];  Service: Gastroenterology;  Laterality: N/A;   COLONOSCOPY N/A 06/05/2020   Procedure: COLONOSCOPY;  Surgeon: Franky Macho, MD;  Location: [facility];  Service: Gastroenterology;  Laterality: N/A;   WISDOM TOOTH EXTRACTION      OB History     Gravida  3   Para  2   Term      Preterm      AB  1   Living  2      SAB      IAB  1   Ectopic      Multiple      Live Births  2            Home Medications    Prior to Admission medications   Medication Sig Start Date End Date Taking? Authorizing Provider  lidocaine (LIDODERM) 5 % Place 1 patch onto the skin daily. Remove & Discard patch within 12 hours or as directed by MD 06/28/23  Yes Particia Nearing, PA-C  tizanidine (  ZANAFLEX) 2 MG capsule Take 1 capsule (2 mg total) by mouth 3 (three) times daily as needed for muscle spasms. Do not drink alcohol or drive while taking this medication.  May cause drowsiness. 06/28/23  Yes Particia Nearing, PA-C  traMADol (ULTRAM) 50 MG tablet Take 1 tablet (50 mg total) by mouth every 12 (twelve) hours as needed. 06/28/23  Yes Particia Nearing, PA-C  acetaminophen (TYLENOL) 500 MG tablet Take 500 mg by mouth every 6 (six) hours as needed for headache.     [provider]  amLODipine (NORVASC) 10 MG tablet TAKE 1 TABLET BY MOUTH ONCE DAILY. 09/23/22   Adline Potter, NP  aspirin EC 81 MG tablet Take 81 mg by mouth daily. Swallow whole.    [provider]  Cholecalciferol (VITAMIN D3 PO) Take 125 mcg by mouth daily.     [provider]  nicotine (NICODERM CQ - DOSED IN MG/24 HOURS) 21 mg/24hr patch Place 1 patch (21 mg total) onto the skin daily. 04/08/23   Mauro Kaufmann, NP  nystatin-triamcinolone ointment (MYCOLOG) Apply 1 application topically 2 (two) times daily. 07/23/21   Lazaro Arms, MD  rosuvastatin (CRESTOR) 10 MG tablet Take 5 mg by mouth. Twice a week 04/17/20   [provider]  valACYclovir (VALTREX) 1000 MG tablet TAKE 1 TABLET(1000 MG) BY MOUTH DAILY 10/01/22   Cyril Mourning A, NP  Varenicline Tartrate, Starter, 0.5 MG X 11 & 1 MG X 42 TBPK See admin instructions. 03/03/23   [provider]    Family History Family History  Problem Relation Age of Onset   Heart disease Mother    Cancer Father    Stroke Father    Heart disease Maternal Aunt    Heart disease Maternal Uncle    Cancer Paternal Aunt    Cancer Paternal Uncle    Heart disease Paternal Grandfather    Heart disease Paternal Grandmother    Heart disease Maternal Grandmother    Heart disease Maternal Grandfather     Social History Social History   Tobacco Use   Smoking status: Every Day    Current packs/day: 0.50    Average packs/day: 0.5 packs/day for 30.0 years (15.0 ttl pk-yrs)    Types: Cigarettes   Smokeless tobacco: Never  Vaping Use   Vaping status: Never Used  Substance Use Topics   Alcohol use: Yes    Alcohol/week: 12.0 standard drinks of alcohol    Types: 12 Cans of beer per week    Comment: daily   Drug use: No     Allergies   Penicillins   Review of Systems Review of Systems Per HPI  Physical Exam Triage Vital Signs ED Triage Vitals [06/28/23 1500]  Encounter Vitals Group     BP (!) 147/92     Systolic BP Percentile      Diastolic BP Percentile      Pulse Rate 94     Resp 20     Temp 98 F (36.7 C)     Temp Source Oral     SpO2 92 %     Weight      Height      Head Circumference      Peak Flow      Pain Score 10     Pain Loc      Pain Education       Exclude from Growth Chart    No data found.  Updated Vital Signs BP Marland Kitchen)  147/92 (BP Location: Right Arm)   Pulse 94   Temp 98 F (36.7 C) (Oral)   Resp 20   SpO2 92%   Visual Acuity Right Eye Distance:   Left Eye Distance:   Bilateral Distance:    Right Eye Near:   Left Eye Near:    Bilateral Near:     Physical Exam Vitals and nursing note reviewed.  Constitutional:      Appearance: Normal appearance. She is not ill-appearing.  HENT:     Head: Atraumatic.  Eyes:     Extraocular Movements: Extraocular movements intact.     Conjunctiva/sclera: Conjunctivae normal.  Cardiovascular:     Rate and Rhythm: Normal rate and regular rhythm.     Heart sounds: Normal heart sounds.  Pulmonary:     Effort: Pulmonary effort is normal.     Comments: Mildly shallow breathing observed, appears to be splinting secondary to pain.  No bony deformity palpable to the left anterior ribs in the area of her pain and chest rise symmetric bilaterally Musculoskeletal:        General: Tenderness and signs of injury present. No swelling or deformity. Normal range of motion.     Cervical back: Normal range of motion and neck supple.     Comments: Significant tenderness to palpation to the left anterior mid to lower rib region below the breast.  No bone deformity palpable, chest rise symmetric bilaterally  Skin:    General: Skin is warm and dry.     Findings: No bruising or erythema.  Neurological:     Mental Status: She is alert and oriented to person, place, and time.  Psychiatric:        Mood and Affect: Mood normal.        Thought Content: Thought content normal.        Judgment: Judgment normal.      UC Treatments / Results  Labs (all labs ordered are listed, but only abnormal results are displayed) Labs Reviewed - No data to display  EKG   Radiology No results found.  Procedures Procedures (including critical care time)  Medications Ordered in UC Medications - No data to  display  Initial Impression / Assessment and Plan / UC Course  I have reviewed the triage vital signs and the nursing notes.  Pertinent labs & imaging results that were available during my care of the patient were reviewed by me and considered in my medical decision making (see chart for details).     X-ray today showing a mildly displaced rib fracture to the left fifth and sixth ribs but no abnormalities within the lungs.  Vitals and exam today overall very reassuring and she appears in no acute distress.  Will treat her pain with Zanaflex, Lidoderm, tramadol and follow-up with PCP for a recheck.  Knows to go to the emergency department with any significantly worsening breathing issues.  Final Clinical Impressions(s) / UC Diagnoses   Final diagnoses:  Rib pain on left side     Discharge Instructions      I have sent over some medications to help with your pain.  If anything comes back new abnormal on your x-ray I will give you a call.  Go to the emergency department if your symptoms ever severely worsen    ED Prescriptions     Medication Sig Dispense Auth. Provider   tizanidine (ZANAFLEX) 2 MG capsule Take 1 capsule (2 mg total) by mouth 3 (three) times daily as needed for  muscle spasms. Do not drink alcohol or drive while taking this medication.  May cause drowsiness. 15 capsule Particia Nearing, PA-C   lidocaine (LIDODERM) 5 % Place 1 patch onto the skin daily. Remove & Discard patch within 12 hours or as directed by MD 30 patch Particia Nearing, PA-C   traMADol (ULTRAM) 50 MG tablet Take 1 tablet (50 mg total) by mouth every 12 (twelve) hours as needed. 10 tablet Particia Nearing, [place]      I have reviewed the PDMP during this encounter.   Particia Nearing, [place] 07/01/23 1730

## 2023-08-07 ENCOUNTER — Ambulatory Visit (HOSPITAL_COMMUNITY)
Admission: RE | Admit: 2023-08-07 | Discharge: 2023-08-07 | Disposition: A | Discharge status: Home / Self Care | Payer: Medicare HMO | Source: Ambulatory Visit | Attending: Internal Medicine | Admitting: Internal Medicine

## 2023-08-07 DIAGNOSIS — Z87898 Personal history of other specified conditions: Secondary | ICD-10-CM | POA: Diagnosis not present

## 2023-08-07 DIAGNOSIS — J439 Emphysema, unspecified: Secondary | ICD-10-CM | POA: Diagnosis not present

## 2023-08-07 DIAGNOSIS — Z122 Encounter for screening for malignant neoplasm of respiratory organs: Secondary | ICD-10-CM | POA: Diagnosis not present

## 2023-08-07 DIAGNOSIS — F17210 Nicotine dependence, cigarettes, uncomplicated: Secondary | ICD-10-CM | POA: Insufficient documentation

## 2023-08-07 DIAGNOSIS — I700 Atherosclerosis of aorta: Secondary | ICD-10-CM | POA: Diagnosis not present

## 2023-08-07 DIAGNOSIS — R911 Solitary pulmonary nodule: Secondary | ICD-10-CM | POA: Diagnosis present

## 2023-08-07 DIAGNOSIS — D582 Other hemoglobinopathies: Secondary | ICD-10-CM | POA: Diagnosis not present

## 2023-08-14 ENCOUNTER — Inpatient Hospital Stay: Payer: Medicare HMO | Attending: Oncology | Admitting: Oncology

## 2023-08-14 DIAGNOSIS — D751 Secondary polycythemia: Secondary | ICD-10-CM | POA: Insufficient documentation

## 2023-08-14 DIAGNOSIS — D582 Other hemoglobinopathies: Secondary | ICD-10-CM

## 2023-08-14 DIAGNOSIS — F17210 Nicotine dependence, cigarettes, uncomplicated: Secondary | ICD-10-CM | POA: Insufficient documentation

## 2023-08-14 NOTE — Progress Notes (Unsigned)
Virtual Visit via Telephone Note  I connected with Raylynn [employer] on 08/14/23 at  1:30 PM EST by telephone and verified that I am speaking with the correct person using two identifiers.  Location: Patient: Home  Provider: Clinic    I discussed the limitations, risks, security and privacy concerns of performing an evaluation and management service by telephone and the availability of in person appointments. I also discussed with the patient that there may be a patient responsible charge related to this service. The patient expressed understanding and agreed to proceed.   History of Present Illness: Mrs. Janiak is a 67 year old female who is seen for erythrocytosis.  She was last evaluated in clinic on 04/08/2023 by me.  She presents today for follow-up.  Since her last visit she was seen at [facility] urgent care on 06/28/2023 for a fall injuring the left side of her ribs.  Imaging showed a mildly displaced rib fracture to the left fifth and sixth ribs but no abnormalities within the lungs.  Today, she reports improvement of rib pain.  Appetite is 85% energy levels are 65%.  Having 7 out of 10 leg pain.  Has nausea and diarrhea at times.  Has occasional chest pain and cough.  Slowly improving. Has bilateral leg pain that happens when she lies down. Mild vision changes. Use low salt. Has HA. Dizziness and  Works in the yard. Tries to stay active.    Observations/Objective: Review of Systems  Constitutional:  Positive for malaise/fatigue.  HENT:  Positive for congestion.   Respiratory:  Positive for cough.   Cardiovascular:  Positive for chest pain.  Gastrointestinal:  Positive for constipation and diarrhea.  Musculoskeletal:  Positive for falls.       Rib pain      Physical Exam Neurological:     Mental Status: She is alert and oriented to person, place, and time.      Assessment and Plan: 1. Elevated hemoglobin (HCC) - Labs from 03/13/2023 showed hemoglobin of 16.3 with a normal  differential.  JAK2 with reflex was negative.  Erythropoietin levels are within normal range. -Urinalysis not reveal any hematuria. - Erythrocytosis is likely secondary to cigarette smoking. -We discussed smoking cessation in detail.  She is currently taking Chantix although not consistently.  She is interested in starting a nicotine patch.  21 mg nicotine patch sent to her pharmacy for her to start daily.  Discussed the role of the nicotine patch along with Chantix.  Apply each morning after removing the previous day nicotine patch.  -Return to clinic in 4 to 5 months for follow-up with labs a few days before and open visit.    Follow Up Instructions:    I discussed the assessment and treatment plan with the patient. The patient was provided an opportunity to ask questions and all were answered. The patient agreed with the plan and demonstrated an understanding of the instructions.   The patient was advised to call back or seek an in-person evaluation if the symptoms worsen or if the condition fails to improve as anticipated.  I provided 20 minutes of non-face-to-face time during this encounter.   Mauro Kaufmann, NP

## 2023-10-13 ENCOUNTER — Other Ambulatory Visit (HOSPITAL_COMMUNITY): Payer: Self-pay | Admitting: Internal Medicine

## 2023-10-13 DIAGNOSIS — Z1231 Encounter for screening mammogram for malignant neoplasm of breast: Secondary | ICD-10-CM

## 2023-10-19 ENCOUNTER — Telehealth: Payer: Self-pay | Admitting: *Deleted

## 2023-10-19 NOTE — Telephone Encounter (Signed)
Dr. Rayburn Ma prescribed Calcipotriene 0.005% cream on 10/13/23. We received the PA. [facility] was advised that Dr. Nile Riggs office needs to do PA. JSY

## 2023-10-26 ENCOUNTER — Ambulatory Visit: Payer: Medicare HMO | Admitting: Adult Health

## 2023-10-26 ENCOUNTER — Encounter: Payer: Self-pay | Admitting: Adult Health

## 2023-10-26 VITALS — BP 121/76 | HR 73 | Ht 65.0 in | Wt 129.0 lb

## 2023-10-26 DIAGNOSIS — B369 Superficial mycosis, unspecified: Secondary | ICD-10-CM | POA: Diagnosis not present

## 2023-10-26 DIAGNOSIS — R21 Rash and other nonspecific skin eruption: Secondary | ICD-10-CM

## 2023-10-26 MED ORDER — FLUCONAZOLE 150 MG PO TABS: ORAL_TABLET | ORAL | 1 refills | Status: AC

## 2023-10-26 MED ORDER — NYSTATIN-TRIAMCINOLONE 100000-0.1 UNIT/GM-% EX OINT: 1.0000 | TOPICAL_OINTMENT | Freq: Two times a day (BID) | CUTANEOUS | 2 refills | Status: AC

## 2023-10-26 NOTE — Progress Notes (Signed)
  Subjective:     Patient ID: Julia Marsh, female   DOB: 1955/12/01, 68 y.o.   MRN: [id number]  HPI Julia Marsh is a 68 year old white female, widowed, PM in complaining of red itchy rash in groin and red scaly rash over body. Has been to [facility] and on skiing trip.     Component Value Date/Time   DIAGPAP  07/23/2021 1555    - Negative for intraepithelial lesion or malignancy (NILM)   DIAGPAP  06/24/2018 0000    NEGATIVE FOR INTRAEPITHELIAL LESIONS OR MALIGNANCY.   HPVHIGH Negative 07/23/2021 1555   ADEQPAP  07/23/2021 1555    Satisfactory for evaluation; transformation zone component PRESENT.   ADEQPAP  06/24/2018 0000    Satisfactory for evaluation  endocervical/transformation zone component PRESENT.   PCP is Dr Ouida Sills Review of Systems Has red, itchy rash in groin area, better since using mycolog, that was expired   Has red scaly round rash over legs and arms, now on face, she wonders if psoriasis   Denies any new sex partners, or problems with urination or BM Reviewed past medical,surgical, social and family history. Reviewed medications and allergies.  Objective:   Physical Exam BP 121/76 (BP Location: Left Arm, Patient Position: Sitting, Cuff Size: Normal)   Pulse 73   Ht 5\' 5"  (1.651 m)   Wt 129 lb (58.5 kg)   BMI 21.47 kg/m     Skin warm and dry.Pelvic: external genitalia has red rash in groin and red scaly round spots on mons pubis and legs vagina: white pale, scant white discharge, no odor,urethra has no lesions or masses noted, cervix:smooth,uterus: normal size, shape and contour, non tender, no masses felt, adnexa: no masses or tenderness noted. Bladder is non tender and no masses felt. Assessment:     1. Superficial fungus infection of skin (Primary) Has red rash in groin area,  Will rx mycolog and diflucan  Meds ordered this encounter  Medications   nystatin-triamcinolone ointment (MYCOLOG)    Sig: Apply 1 Application topically 2 (two) times daily.    Dispense:  30 g     Refill:  2    Supervising Provider:   Duane Lope H [[id number]]   fluconazole (DIFLUCAN) 150 MG tablet    Sig: Take 1 now and 1 in 3 days, do not crestor when takes diflucan    Dispense:  2 tablet    Refill:  1    Supervising Provider:   Duane Lope H [[id number]]    2. Rash  Has red scaly round rash over legs and arms, now on face,   Has appt with dermatologist 10/28/23 Plan:     Follow up prn

## 2023-10-28 ENCOUNTER — Ambulatory Visit (HOSPITAL_COMMUNITY)
Admission: RE | Admit: 2023-10-28 | Discharge: 2023-10-28 | Disposition: A | Discharge status: Home / Self Care | Payer: Medicare HMO | Source: Ambulatory Visit | Attending: Internal Medicine | Admitting: Internal Medicine

## 2023-10-28 DIAGNOSIS — Z1231 Encounter for screening mammogram for malignant neoplasm of breast: Secondary | ICD-10-CM | POA: Diagnosis not present

## 2023-10-28 DIAGNOSIS — L408 Other psoriasis: Secondary | ICD-10-CM | POA: Diagnosis not present

## 2023-11-19 ENCOUNTER — Ambulatory Visit: Payer: Medicare HMO | Admitting: Adult Health

## 2023-11-19 ENCOUNTER — Ambulatory Visit (HOSPITAL_COMMUNITY): Payer: Medicare HMO

## 2023-11-24 ENCOUNTER — Other Ambulatory Visit: Payer: Self-pay | Admitting: Adult Health

## 2023-12-08 ENCOUNTER — Other Ambulatory Visit: Payer: Self-pay | Admitting: Adult Health

## 2023-12-16 DIAGNOSIS — C44319 Basal cell carcinoma of skin of other parts of face: Secondary | ICD-10-CM | POA: Diagnosis not present

## 2023-12-16 DIAGNOSIS — L408 Other psoriasis: Secondary | ICD-10-CM | POA: Diagnosis not present

## 2023-12-24 ENCOUNTER — Inpatient Hospital Stay: Payer: Medicare HMO

## 2023-12-31 ENCOUNTER — Encounter: Payer: Self-pay | Admitting: Oncology

## 2023-12-31 ENCOUNTER — Inpatient Hospital Stay: Payer: Medicare HMO | Attending: Oncology | Admitting: Oncology

## 2023-12-31 ENCOUNTER — Inpatient Hospital Stay: Admitting: Oncology

## 2023-12-31 VITALS — BP 133/85 | HR 65 | Resp 18 | Wt 129.4 lb

## 2023-12-31 DIAGNOSIS — D582 Other hemoglobinopathies: Secondary | ICD-10-CM | POA: Diagnosis not present

## 2023-12-31 DIAGNOSIS — F17210 Nicotine dependence, cigarettes, uncomplicated: Secondary | ICD-10-CM | POA: Insufficient documentation

## 2023-12-31 DIAGNOSIS — D751 Secondary polycythemia: Secondary | ICD-10-CM | POA: Insufficient documentation

## 2023-12-31 LAB — CBC WITH DIFFERENTIAL/PLATELET
Abs Immature Granulocytes: 0.03 10*3/uL (ref 0.00–0.07)
Basophils Absolute: 0 10*3/uL (ref 0.0–0.1)
Basophils Relative: 0 %
Eosinophils Absolute: 0.1 10*3/uL (ref 0.0–0.5)
Eosinophils Relative: 1 %
HCT: 46.8 % — ABNORMAL HIGH (ref 36.0–46.0)
Hemoglobin: 16.2 g/dL — ABNORMAL HIGH (ref 12.0–15.0)
Immature Granulocytes: 0 %
Lymphocytes Relative: 28 %
Lymphs Abs: 2.3 10*3/uL (ref 0.7–4.0)
MCH: 34.4 pg — ABNORMAL HIGH (ref 26.0–34.0)
MCHC: 34.6 g/dL (ref 30.0–36.0)
MCV: 99.4 fL (ref 80.0–100.0)
Monocytes Absolute: 0.5 10*3/uL (ref 0.1–1.0)
Monocytes Relative: 7 %
Neutro Abs: 5.1 10*3/uL (ref 1.7–7.7)
Neutrophils Relative %: 64 %
Platelets: 267 10*3/uL (ref 150–400)
RBC: 4.71 MIL/uL (ref 3.87–5.11)
RDW: 12.6 % (ref 11.5–15.5)
WBC: 8 10*3/uL (ref 4.0–10.5)
nRBC: 0 % (ref 0.0–0.2)

## 2023-12-31 NOTE — Progress Notes (Signed)
 [facility] [address] [place] [address]   Clinic Day:  12/31/2023  Referring physician: Carylon Perches, MD  Patient Care Team: Julia Perches, MD as PCP - General (Internal Medicine)   ASSESSMENT & PLAN:   Assessment:  1.  Erythrocytosis: - Patient seen at the request of Dr. Ouida Sills. - 02/25/2023: Hb-16.4, HCT-46.4, WBC-9.7, PLT-255 - 08/19/2022: Hb-17.2, HCT-49.1 - She has elevated hemoglobin ranging between 16-17 g and hematocrit ranging between 47-49 for the past few years, at least since 2016. - She is current active smoker. - Denies any aquagenic pruritus or vasomotor symptoms.  No history of thrombosis.  2.  Social/family history: - She retired in 2013, worked as a Retail buyer at [employer].  No chemical exposure.  Smokes three fourths of a pack to 1 pack/day for 40 years. - Paternal grandmother died of leukemia.  Father had cancer.  Brother had prostate cancer.  Plan:  1.  Erythrocytosis: -Hematology workup from 03/13/2023-JAK2 with reflex was negative.  Erythropoietin levels were WNL. Urinalysis did not reveal any hematuria. -Lab work from 12/31/2023 shows a hemoglobin of 16.2 (16.3) with MCV 99.4.  Hematocrit 46.8 (47.5).  Otherwise differential is unremarkable - Erythrocytosis is likely secondary to cigarette smoking. -She continues to smoke but is interested in quitting.  She already has nicotine patches and Chantix but does not consistently take either. -We again discussed smoking cessation in detail.  She is interested in attempting to quit.  PLAN SUMMARY: >> Return to clinic in 6 months with labs a few days before and telephone visit. >> Continue Chantix and may start nicotine patch.     Orders Placed This Encounter  Procedures   CBC with Differential/Platelet    Standing Status:   Future    Expected Date:   07/01/2024    Expiration Date:   12/30/2024    Release to patient:   Immediate    Mauro Kaufmann, NP   4/10/202511:29  AM  CHIEF COMPLAINT/PURPOSE OF CONSULT:   Diagnosis: secondary polycythemia  Current Therapy: Surveillance  HISTORY OF PRESENT ILLNESS:   Julia Marsh is a 68 y.o. female presenting to clinic today for follow-up and to review labs for secondary polycythemia.  She was last seen in clinic by me on 08/14/2023 virtually.  She denies any interval hospitalizations, surgeries or changes to her baseline health.  Appetite is 75% energy levels are 80%.  She denies any pain.  Reports doing well since her last visit.  Reports she had a molar pulled yesterday at her dentist and will be having the MOS procedure for basal cell carcinoma to left cheek on Monday.  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  Reports she continues to smoke cigarettes will occasionally apply a patch but has not used them consistently.  She continues to smoke approximately 12 to 15 cigarettes/day.  She has chronic shortness of breath secondary to tobacco abuse.  Reports she tries to stay active.  She has been doing a lot of traveling.  PAST MEDICAL HISTORY:   Past Medical History: Past Medical History:  Diagnosis Date   Bartholin cyst    Dyslipidemia 06/10/2016   Will recheck fasting   Enlarged thyroid    Hemorrhoids    Herpes simplex without mention of complication    Hypertension    Incidental lung nodule, > 3mm and < 8mm 09/10/2018   6.9 mm nodule in lung repeat low dose CT in 6 months    Liver disease  hep C   Mild vitamin D deficiency 06/10/2016   Psoriasis     Surgical History: Past Surgical History:  Procedure Laterality Date   BREAST BIOPSY     COLONOSCOPY N/A 12/19/2014   Procedure: COLONOSCOPY;  Surgeon: Franky Macho Md, MD;  Location: [facility];  Service: Gastroenterology;  Laterality: N/A;   COLONOSCOPY N/A 06/05/2020   Procedure: COLONOSCOPY;  Surgeon: Franky Macho, MD;  Location: [facility];  Service: Gastroenterology;  Laterality: N/A;   WISDOM TOOTH EXTRACTION       Social History: Social History   Socioeconomic History   Marital status: Widowed    Spouse name: Not on file   Number of children: 2   Years of education: Not on file   Highest education level: Not on file  Occupational History   Not on file  Tobacco Use   Smoking status: Every Day    Current packs/day: 0.50    Average packs/day: 0.5 packs/day for 30.0 years (15.0 ttl pk-yrs)    Types: Cigarettes   Smokeless tobacco: Never  Vaping Use   Vaping status: Never Used  Substance and Sexual Activity   Alcohol use: Yes    Alcohol/week: 12.0 standard drinks of alcohol    Types: 12 Cans of beer per week    Comment: daily   Drug use: No   Sexual activity: Yes    Birth control/protection: Post-menopausal  Other Topics Concern   Not on file  Social History Narrative   Not on file   Social Drivers of Health   Financial Resource Strain: Low Risk  (10/02/2022)   Overall Financial Resource Strain (CARDIA)    Difficulty of Paying Living Expenses: Not hard at all  Food Insecurity: No Food Insecurity (10/02/2022)   Hunger Vital Sign    Worried About Running Out of Food in the Last Year: Never true    Ran Out of Food in the Last Year: Never true  Transportation Needs: No Transportation Needs (10/02/2022)   PRAPARE - Administrator, Civil Service (Medical): No    Lack of Transportation (Non-Medical): No  Physical Activity: Sufficiently Active (10/02/2022)   Exercise Vital Sign    Days of Exercise per Week: 3 days    Minutes of Exercise per Session: 70 min  Stress: No Stress Concern Present (10/02/2022)   [employer] of Occupational Health - Occupational Stress Questionnaire    Feeling of Stress : Not at all  Social Connections: Moderately Isolated (10/02/2022)   Social Connection and Isolation Panel [NHANES]    Frequency of Communication with Friends and Family: More than three times a week    Frequency of Social Gatherings with Friends and Family: Three times a week     Attends Religious Services: 1 to 4 times per year    Active Member of Clubs or Organizations: No    Attends Banker Meetings: Never    Marital Status: Widowed  Intimate Partner Violence: Not At Risk (10/02/2022)   Humiliation, Afraid, Rape, and Kick questionnaire    Fear of Current or Ex-Partner: No    Emotionally Abused: No    Physically Abused: No    Sexually Abused: No    Family History: Family History  Problem Relation Age of Onset   Heart disease Mother    Cancer Father    Stroke Father    Heart disease Maternal Aunt    Heart disease Maternal Uncle    Cancer Paternal Aunt    Cancer Paternal Uncle  Heart disease Paternal Grandfather    Heart disease Paternal Grandmother    Heart disease Maternal Grandmother    Heart disease Maternal Grandfather     Current Medications:  Current Outpatient Medications:    acetaminophen (TYLENOL) 500 MG tablet, Take 500 mg by mouth every 6 (six) hours as needed for headache. , Disp: , Rfl:    amLODipine (NORVASC) 10 MG tablet, TAKE 1 TABLET BY MOUTH ONCE DAILY., Disp: 90 tablet, Rfl: 4   calcipotriene (DOVONOX) 0.005 % cream, Apply topically., Disp: , Rfl:    Cholecalciferol (VITAMIN D3 PO), Take 125 mcg by mouth daily., Disp: , Rfl:    loratadine (CLARITIN) 10 MG tablet, Take 10 mg by mouth daily., Disp: , Rfl:    nicotine (NICODERM CQ - DOSED IN MG/24 HOURS) 21 mg/24hr patch, Place 1 patch (21 mg total) onto the skin daily., Disp: 28 patch, Rfl: 0   nystatin-triamcinolone ointment (MYCOLOG), Apply 1 Application topically 2 (two) times daily., Disp: 30 g, Rfl: 2   rosuvastatin (CRESTOR) 10 MG tablet, Take 5 mg by mouth. Twice a week, Disp: , Rfl:    valACYclovir (VALTREX) 1000 MG tablet, TAKE 1 TABLET(1000 MG) BY MOUTH DAILY, Disp: 30 tablet, Rfl: 12   fluconazole (DIFLUCAN) 150 MG tablet, Take 1 now and 1 in 3 days, do not crestor when takes diflucan (Patient not taking: Reported on 12/31/2023), Disp: 2 tablet, Rfl: 1    Allergies: Allergies  Allergen Reactions   Penicillins Rash    40 years ago    REVIEW OF SYSTEMS:   Review of Systems  Respiratory:  Positive for shortness of breath.   Gastrointestinal:  Positive for nausea.  Neurological:  Positive for headaches.     VITALS:   Blood pressure 133/85, pulse 65, resp. rate 18, weight 129 lb 6.6 oz (58.7 kg), SpO2 98%.  Wt Readings from Last 3 Encounters:  12/31/23 129 lb 6.6 oz (58.7 kg)  10/26/23 129 lb (58.5 kg)  04/08/23 126 lb 6.4 oz (57.3 kg)    Body mass index is 21.53 kg/m.   PHYSICAL EXAM:   Physical Exam Constitutional:      Appearance: Normal appearance.  Cardiovascular:     Rate and Rhythm: Normal rate and regular rhythm.  Pulmonary:     Effort: Pulmonary effort is normal.     Breath sounds: Normal breath sounds.  Abdominal:     General: Bowel sounds are normal.     Palpations: Abdomen is soft.  Musculoskeletal:        General: Normal range of motion.  Neurological:     Mental Status: She is alert and oriented to person, place, and time. Mental status is at baseline.     LABS:      Latest Ref Rng & Units 12/31/2023   10:29 AM 03/13/2023    9:15 AM 06/24/2018   10:35 AM  CBC  WBC 4.0 - 10.5 K/uL 8.0  8.6  7.3   Hemoglobin 12.0 - 15.0 g/dL 16.1  09.6  04.5   Hematocrit 36.0 - 46.0 % 46.8  47.5  47.7   Platelets 150 - 400 K/uL 267  247  229       Latest Ref Rng & Units 06/24/2018   10:35 AM 06/12/2017   10:27 AM 07/02/2016   10:59 AM  CMP  Glucose 65 - 99 mg/dL 91  96  88   BUN 8 - 27 mg/dL 9  12  8    Creatinine 0.57 - 1.00 mg/dL 4.09  0.91  0.81   Sodium 134 - 144 mmol/L 140  141  143   Potassium 3.5 - 5.2 mmol/L 4.3  4.7  4.6   Chloride 96 - 106 mmol/L 101  99  102   CO2 20 - 29 mmol/L 25  24  25    Calcium 8.7 - 10.3 mg/dL 16.1  09.6  9.2   Total Protein 6.0 - 8.5 g/dL 7.5  7.6  7.3   Total Bilirubin 0.0 - 1.2 mg/dL 0.6  0.8  0.3   Alkaline Phos 39 - 117 IU/L 94  98  95   AST 0 - 40 IU/L 58  20  31    ALT 0 - 32 IU/L 62  20  33      No results found for: "CEA1", "CEA" / No results found for: "CEA1", "CEA" No results found for: "PSA1" No results found for: "EAV409" No results found for: "CAN125"  No results found for: "TOTALPROTELP", "ALBUMINELP", "A1GS", "A2GS", "BETS", "BETA2SER", "GAMS", "MSPIKE", "SPEI" No results found for: "TIBC", "FERRITIN", "IRONPCTSAT" No results found for: "LDH"   STUDIES:   No results found.

## 2024-01-04 DIAGNOSIS — C44319 Basal cell carcinoma of skin of other parts of face: Secondary | ICD-10-CM | POA: Diagnosis not present

## 2024-01-14 DIAGNOSIS — C44319 Basal cell carcinoma of skin of other parts of face: Secondary | ICD-10-CM | POA: Diagnosis not present

## 2024-02-18 DIAGNOSIS — L237 Allergic contact dermatitis due to plants, except food: Secondary | ICD-10-CM | POA: Diagnosis not present

## 2024-02-23 DIAGNOSIS — L408 Other psoriasis: Secondary | ICD-10-CM | POA: Diagnosis not present

## 2024-02-23 DIAGNOSIS — Z85828 Personal history of other malignant neoplasm of skin: Secondary | ICD-10-CM | POA: Diagnosis not present

## 2024-02-23 DIAGNOSIS — Z08 Encounter for follow-up examination after completed treatment for malignant neoplasm: Secondary | ICD-10-CM | POA: Diagnosis not present

## 2024-07-21 ENCOUNTER — Inpatient Hospital Stay: Admitting: Oncology

## 2024-07-21 ENCOUNTER — Inpatient Hospital Stay: Attending: Oncology

## 2024-07-21 VITALS — BP 146/94 | HR 90 | Temp 98.1°F | Resp 18 | Ht 65.0 in | Wt 130.0 lb

## 2024-07-21 DIAGNOSIS — R0981 Nasal congestion: Secondary | ICD-10-CM | POA: Insufficient documentation

## 2024-07-21 DIAGNOSIS — F17210 Nicotine dependence, cigarettes, uncomplicated: Secondary | ICD-10-CM | POA: Diagnosis not present

## 2024-07-21 DIAGNOSIS — D751 Secondary polycythemia: Secondary | ICD-10-CM | POA: Diagnosis present

## 2024-07-21 DIAGNOSIS — Z806 Family history of leukemia: Secondary | ICD-10-CM | POA: Diagnosis not present

## 2024-07-21 DIAGNOSIS — Z85828 Personal history of other malignant neoplasm of skin: Secondary | ICD-10-CM | POA: Insufficient documentation

## 2024-07-21 DIAGNOSIS — Z8042 Family history of malignant neoplasm of prostate: Secondary | ICD-10-CM | POA: Diagnosis not present

## 2024-07-21 DIAGNOSIS — D582 Other hemoglobinopathies: Secondary | ICD-10-CM

## 2024-07-21 LAB — CBC WITH DIFFERENTIAL/PLATELET
Abs Immature Granulocytes: 0.05 K/uL (ref 0.00–0.07)
Basophils Absolute: 0 K/uL (ref 0.0–0.1)
Basophils Relative: 0 %
Eosinophils Absolute: 0.1 K/uL (ref 0.0–0.5)
Eosinophils Relative: 1 %
HCT: 47.8 % — ABNORMAL HIGH (ref 36.0–46.0)
Hemoglobin: 16.3 g/dL — ABNORMAL HIGH (ref 12.0–15.0)
Immature Granulocytes: 1 %
Lymphocytes Relative: 27 %
Lymphs Abs: 2.6 K/uL (ref 0.7–4.0)
MCH: 33.4 pg (ref 26.0–34.0)
MCHC: 34.1 g/dL (ref 30.0–36.0)
MCV: 98 fL (ref 80.0–100.0)
Monocytes Absolute: 0.7 K/uL (ref 0.1–1.0)
Monocytes Relative: 7 %
Neutro Abs: 6.3 K/uL (ref 1.7–7.7)
Neutrophils Relative %: 64 %
Platelets: 270 K/uL (ref 150–400)
RBC: 4.88 MIL/uL (ref 3.87–5.11)
RDW: 12.9 % (ref 11.5–15.5)
WBC: 9.8 K/uL (ref 4.0–10.5)
nRBC: 0 % (ref 0.0–0.2)

## 2024-07-21 NOTE — Assessment & Plan Note (Addendum)
-  Hematology workup from 03/13/2023-JAK2 with reflex was negative.  Erythropoietin  levels were WNL. Urinalysis did not reveal any hematuria. -Lab work from 07/21/2024 shows hemoglobin of 16.3 (16.2) and hematocrit of 47.8 (46.8).  Otherwise differential is unremarkable - Erythrocytosis is likely secondary to cigarette smoking. -She continues to smoke but is interested in quitting.  She already has nicotine  patches and Chantix  but does not consistently take either. -We again discussed smoking cessation in detail.  She is interested in attempting to quit. -We discussed discharge from clinic but she would like to continue to be followed annually.  Return to clinic in 1 year with labs.

## 2024-07-21 NOTE — Progress Notes (Signed)
 [facility] OFFICE PROGRESS NOTE  Sheryle Carwin, MD  ASSESSMENT & PLAN:    Assessment & Plan Erythrocytosis -Hematology workup from 03/13/2023-JAK2 with reflex was negative.  Erythropoietin  levels were WNL. Urinalysis did not reveal any hematuria. -Lab work from 07/21/2024 shows hemoglobin of 16.3 (16.2) and hematocrit of 47.8 (46.8).  Otherwise differential is unremarkable - Erythrocytosis is likely secondary to cigarette smoking. -She continues to smoke but is interested in quitting.  She already has nicotine  patches and Chantix  but does not consistently take either. -We again discussed smoking cessation in detail.  She is interested in attempting to quit. -We discussed discharge from clinic but she would like to continue to be followed annually.  Return to clinic in 1 year with labs. Sinus congestion Discussed trying fluticasone/Flonase 2 sprays each nostril twice daily along with normal saline nasal spray.  Continue antihistamine add in decongestant such as Sudafed or DayQuil/NyQuil.  You certainly can continue Alka-Seltzer.  Please let me know if symptoms worsen or fail to improve.  Will call in Augmentin if that is the case.  Orders Placed This Encounter  Procedures   CBC with Differential/Platelet    Standing Status:   Future    Expected Date:   07/21/2025    Expiration Date:   10/19/2025    Release to patient:   Immediate    INTERVAL HISTORY: Patient returns for follow-up for elevated hemoglobin.  Reports overall she is doing well.  Energy levels are 100%.  Reports has chronic cough but more recently has developed nasal congestion and facial pain.  This has been present for about 10 days.  She has been taking Alka-Seltzer and using an antihistamine daily.  Reports occasional headaches.  Occasionally experiences sedations.  Reports she had skin cancer removed from her face left cheek. She also had a wisdom tooth infection since her last visit with us .  Reports really  busy with family.   No recent hospitalizations, surgeries or changes to her baseline health.  We reviewed CBC.  SUMMARY OF HEMATOLOGIC HISTORY: Oncology History Overview Note  1.  Erythrocytosis: - Patient seen at the request of Dr. Sheryle. - 02/25/2023: Hb-16.4, HCT-46.4, WBC-9.7, PLT-255 - 08/19/2022: Hb-17.2, HCT-49.1 - She has elevated hemoglobin ranging between 16-17 g and hematocrit ranging between 47-49 for the past few years, at least since 2016. - She is current active smoker. - Denies any aquagenic pruritus or vasomotor symptoms.  No history of thrombosis.   2.  Social/family history: - She retired in 2013, worked as a retail buyer at [employer], site.  No chemical exposure.  Smokes three fourths of a pack to 1 pack/day for 40 years. - Paternal grandmother died of leukemia.  Father had cancer.  Brother had prostate cancer.      No history exists.     CBC    Component Value Date/Time   WBC 9.8 07/21/2024 0949   RBC 4.88 07/21/2024 0949   HGB 16.3 (H) 07/21/2024 0949   HGB 16.1 (H) 06/24/2018 1035   HCT 47.8 (H) 07/21/2024 0949   HCT 47.7 (H) 06/24/2018 1035   PLT 270 07/21/2024 0949   PLT 229 06/24/2018 1035   MCV 98.0 07/21/2024 0949   MCV 96 06/24/2018 1035   MCH 33.4 07/21/2024 0949   MCHC 34.1 07/21/2024 0949   RDW 12.9 07/21/2024 0949   RDW 14.1 06/24/2018 1035   LYMPHSABS 2.6 07/21/2024 0949   MONOABS 0.7 07/21/2024 0949   EOSABS 0.1 07/21/2024 0949   BASOSABS  0.0 07/21/2024 0949       Latest Ref Rng & Units 06/24/2018   10:35 AM 06/12/2017   10:27 AM 07/02/2016   10:59 AM  CMP  Glucose 65 - 99 mg/dL 91  96  88   BUN 8 - 27 mg/dL 9  12  8    Creatinine 0.57 - 1.00 mg/dL 9.28  9.08  9.18   Sodium 134 - 144 mmol/L 140  141  143   Potassium 3.5 - 5.2 mmol/L 4.3  4.7  4.6   Chloride 96 - 106 mmol/L 101  99  102   CO2 20 - 29 mmol/L 25  24  25    Calcium 8.7 - 10.3 mg/dL 89.8  89.6  9.2   Total Protein 6.0 - 8.5 g/dL 7.5  7.6  7.3   Total Bilirubin 0.0 -  1.2 mg/dL 0.6  0.8  0.3   Alkaline Phos 39 - 117 IU/L 94  98  95   AST 0 - 40 IU/L 58  20  31   ALT 0 - 32 IU/L 62  20  33      No results found for: FERRITIN, VITAMINB12  Vitals:   07/21/24 1039  BP: (!) 146/94  Pulse: 90  Resp: 18  Temp: 98.1 F (36.7 C)  SpO2: 99%    Review of System:  Review of Systems  HENT:  Positive for congestion and sinus pain.   Respiratory:  Positive for cough.   Cardiovascular:  Positive for palpitations.  Neurological:  Positive for headaches.    Physical Exam: Physical Exam Constitutional:      Appearance: Normal appearance.  HENT:     Head: Normocephalic and atraumatic.  Eyes:     Pupils: Pupils are equal, round, and reactive to light.  Cardiovascular:     Rate and Rhythm: Normal rate and regular rhythm.     Heart sounds: Normal heart sounds. No murmur heard. Pulmonary:     Effort: Pulmonary effort is normal.     Breath sounds: Normal breath sounds. No wheezing.  Abdominal:     General: Bowel sounds are normal. There is no distension.     Palpations: Abdomen is soft.     Tenderness: There is no abdominal tenderness.  Musculoskeletal:        General: Normal range of motion.     Cervical back: Normal range of motion.  Skin:    General: Skin is warm and dry.     Findings: No rash.  Neurological:     Mental Status: She is alert and oriented to person, place, and time.     Gait: Gait is intact.  Psychiatric:        Mood and Affect: Mood and affect normal.        Cognition and Memory: Memory normal.        Judgment: Judgment normal.      I spent 25 minutes dedicated to the care of this patient (face-to-face and non-face-to-face) on the date of the encounter to include what is described in the assessment and plan.,  Delon Hope, NP 07/21/2024 11:20 AM

## 2024-07-21 NOTE — Patient Instructions (Signed)
 Start Flonase (fluticasone) twice per day two sprays each nostril.  Continue Claritin/allegra daily.

## 2024-07-21 NOTE — Assessment & Plan Note (Addendum)
 Discussed trying fluticasone/Flonase 2 sprays each nostril twice daily along with normal saline nasal spray.  Continue antihistamine add in decongestant such as Sudafed or DayQuil/NyQuil.  You certainly can continue Alka-Seltzer.  Please let me know if symptoms worsen or fail to improve.  Will call in Augmentin if that is the case.

## 2024-08-24 DIAGNOSIS — H524 Presbyopia: Secondary | ICD-10-CM | POA: Diagnosis not present

## 2024-09-06 ENCOUNTER — Other Ambulatory Visit (HOSPITAL_COMMUNITY): Payer: Self-pay | Admitting: Internal Medicine

## 2024-09-06 DIAGNOSIS — Z87898 Personal history of other specified conditions: Secondary | ICD-10-CM

## 2025-07-20 ENCOUNTER — Inpatient Hospital Stay: Admitting: Oncology

## 2025-07-20 ENCOUNTER — Inpatient Hospital Stay
